# Patient Record
Sex: Female | Born: 1995 | Race: Black or African American | Hispanic: No | Marital: Single | Smoking: Never smoker
Health system: Southern US, Community
[De-identification: ages and names within clinical notes are randomized; demographics above are authoritative.]

## PROBLEM LIST (undated history)

## (undated) DIAGNOSIS — Z8759 Personal history of other complications of pregnancy, childbirth and the puerperium: Secondary | ICD-10-CM

## (undated) DIAGNOSIS — Z01419 Encounter for gynecological examination (general) (routine) without abnormal findings: Secondary | ICD-10-CM

## (undated) DIAGNOSIS — I10 Essential (primary) hypertension: Secondary | ICD-10-CM

## (undated) DIAGNOSIS — D649 Anemia, unspecified: Secondary | ICD-10-CM

## (undated) DIAGNOSIS — N926 Irregular menstruation, unspecified: Secondary | ICD-10-CM

## (undated) DIAGNOSIS — J189 Pneumonia, unspecified organism: Secondary | ICD-10-CM

## (undated) DIAGNOSIS — Z789 Other specified health status: Secondary | ICD-10-CM

## (undated) DIAGNOSIS — N730 Acute parametritis and pelvic cellulitis: Secondary | ICD-10-CM

## (undated) DIAGNOSIS — O009 Unspecified ectopic pregnancy without intrauterine pregnancy: Secondary | ICD-10-CM

## (undated) HISTORY — DX: Anemia, unspecified: D64.9

## (undated) HISTORY — DX: Personal history of other complications of pregnancy, childbirth and the puerperium: Z87.59

## (undated) HISTORY — PX: NO PAST SURGERIES: SHX2092

---
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
## 2014-02-28 ENCOUNTER — Emergency Department (HOSPITAL_COMMUNITY): Payer: Self-pay

## 2014-02-28 ENCOUNTER — Emergency Department (HOSPITAL_COMMUNITY)
Admission: EM | Admit: 2014-02-28 | Discharge: 2014-03-01 | Disposition: A | Discharge status: Home / Self Care | Payer: Self-pay | Attending: Emergency Medicine | Admitting: Emergency Medicine

## 2014-02-28 ENCOUNTER — Encounter (HOSPITAL_COMMUNITY): Payer: Self-pay | Admitting: Emergency Medicine

## 2014-02-28 DIAGNOSIS — R102 Pelvic and perineal pain: Secondary | ICD-10-CM

## 2014-02-28 DIAGNOSIS — Z3202 Encounter for pregnancy test, result negative: Secondary | ICD-10-CM | POA: Insufficient documentation

## 2014-02-28 DIAGNOSIS — N739 Female pelvic inflammatory disease, unspecified: Secondary | ICD-10-CM | POA: Insufficient documentation

## 2014-02-28 DIAGNOSIS — Z791 Long term (current) use of non-steroidal anti-inflammatories (NSAID): Secondary | ICD-10-CM | POA: Insufficient documentation

## 2014-02-28 DIAGNOSIS — N730 Acute parametritis and pelvic cellulitis: Secondary | ICD-10-CM

## 2014-02-28 LAB — COMPREHENSIVE METABOLIC PANEL WITH GFR
ALT: 17 U/L (ref 0–35)
AST: 18 U/L (ref 0–37)
Albumin: 4.2 g/dL (ref 3.5–5.2)
Alkaline Phosphatase: 87 U/L (ref 39–117)
Anion gap: 14 (ref 5–15)
BUN: 11 mg/dL (ref 6–23)
CO2: 22 meq/L (ref 19–32)
Calcium: 9.9 mg/dL (ref 8.4–10.5)
Chloride: 103 meq/L (ref 96–112)
Creatinine, Ser: 0.7 mg/dL (ref 0.50–1.10)
GFR calc Af Amer: >90 mL/min
GFR calc non Af Amer: >90 mL/min
Glucose, Bld: 92 mg/dL (ref 70–99)
Potassium: 4.3 meq/L (ref 3.7–5.3)
Sodium: 139 meq/L (ref 137–147)
Total Bilirubin: 0.3 mg/dL (ref 0.3–1.2)
Total Protein: 8 g/dL (ref 6.0–8.3)

## 2014-02-28 LAB — URINALYSIS, ROUTINE W REFLEX MICROSCOPIC
BILIRUBIN URINE: NEGATIVE
Glucose, UA: NEGATIVE mg/dL
Ketones, ur: NEGATIVE mg/dL
LEUKOCYTES UA: NEGATIVE
NITRITE: NEGATIVE
PROTEIN: NEGATIVE mg/dL
Specific Gravity, Urine: 1.024 (ref 1.005–1.030)
Urobilinogen, UA: 1 mg/dL (ref 0.0–1.0)
pH: 7.5 (ref 5.0–8.0)

## 2014-02-28 LAB — CBC WITH DIFFERENTIAL/PLATELET
Basophils Absolute: 0.1 10*3/uL (ref 0.0–0.1)
Basophils Relative: 0 % (ref 0–1)
EOS PCT: 2 % (ref 0–5)
Eosinophils Absolute: 0.3 10*3/uL (ref 0.0–0.7)
HEMATOCRIT: 43.4 % (ref 36.0–46.0)
Hemoglobin: 14.2 g/dL (ref 12.0–15.0)
LYMPHS PCT: 21 % (ref 12–46)
Lymphs Abs: 2.8 10*3/uL (ref 0.7–4.0)
MCH: 27.4 pg (ref 26.0–34.0)
MCHC: 32.7 g/dL (ref 30.0–36.0)
MCV: 83.6 fL (ref 78.0–100.0)
MONO ABS: 0.7 10*3/uL (ref 0.1–1.0)
MONOS PCT: 5 % (ref 3–12)
Neutro Abs: 9.6 10*3/uL — ABNORMAL HIGH (ref 1.7–7.7)
Neutrophils Relative %: 72 % (ref 43–77)
Platelets: 405 10*3/uL — ABNORMAL HIGH (ref 150–400)
RBC: 5.19 MIL/uL — AB (ref 3.87–5.11)
RDW: 14 % (ref 11.5–15.5)
WBC: 13.4 10*3/uL — ABNORMAL HIGH (ref 4.0–10.5)

## 2014-02-28 LAB — WET PREP, GENITAL
Trich, Wet Prep: NONE SEEN
Yeast Wet Prep HPF POC: NONE SEEN

## 2014-02-28 LAB — PREGNANCY, URINE: Preg Test, Ur: NEGATIVE

## 2014-02-28 LAB — URINE MICROSCOPIC-ADD ON

## 2014-02-28 LAB — LIPASE, BLOOD: Lipase: 32 U/L (ref 11–59)

## 2014-02-28 MED ORDER — LIDOCAINE HCL 1 % IJ SOLN
INTRAMUSCULAR | Status: AC
  Administered 2014-02-28: 0.9 mL
  Filled 2014-02-28: qty 20

## 2014-02-28 MED ORDER — CEFTRIAXONE SODIUM 250 MG IJ SOLR
250.0000 mg | Freq: Once | INTRAMUSCULAR | Status: AC
  Administered 2014-02-28: 250 mg via INTRAMUSCULAR
  Filled 2014-02-28: qty 250

## 2014-02-28 MED ORDER — SODIUM CHLORIDE 0.9 % IV BOLUS (SEPSIS): 1000.0000 mL | Freq: Once | INTRAVENOUS | Status: DC

## 2014-02-28 MED ORDER — DOXYCYCLINE HYCLATE 100 MG PO CAPS: 100.0000 mg | ORAL_CAPSULE | Freq: Two times a day (BID) | ORAL | Status: DC

## 2014-02-28 MED ORDER — HYDROCODONE-ACETAMINOPHEN 5-325 MG PO TABS: 1.0000 | ORAL_TABLET | Freq: Four times a day (QID) | ORAL | Status: DC | PRN

## 2014-02-28 MED ORDER — ONDANSETRON HCL 4 MG/2ML IJ SOLN
4.0000 mg | Freq: Once | INTRAMUSCULAR | Status: DC
  Filled 2014-02-28: qty 2

## 2014-02-28 MED ORDER — IBUPROFEN 800 MG PO TABS: 800.0000 mg | ORAL_TABLET | Freq: Three times a day (TID) | ORAL | Status: DC | PRN

## 2014-02-28 MED ORDER — MORPHINE SULFATE 4 MG/ML IJ SOLN
4.0000 mg | Freq: Once | INTRAMUSCULAR | Status: AC
Start: 2014-02-28 — End: 2014-02-28
  Administered 2014-02-28: 4 mg via INTRAMUSCULAR

## 2014-02-28 MED ORDER — MORPHINE SULFATE 4 MG/ML IJ SOLN
4.0000 mg | Freq: Once | INTRAMUSCULAR | Status: DC
  Filled 2014-02-28 (×2): qty 1

## 2014-02-28 NOTE — ED Notes (Signed)
I had spoken with her earlier and she told me she has had lower abd. Discomfort for about three days and for past two or three days has seen a sm. Amt. Of bright red blood in her stools.  She is in no distress.  She had declined IV at that time.

## 2014-02-28 NOTE — ED Notes (Signed)
Pt from home c/o lower abdominal pain x 1 week with light vaginal bleeding. Pt reports she is unsure if this is period due to depo.

## 2014-02-28 NOTE — ED Notes (Signed)
Pt. Requested pain med before US pelvis be done, this Nurse pulled out ordered pain med and was about to start an IV for the pain meds to be given, pt. REFUSED for IV access and IV pain med. Requested to have PO pain med instead . To notify MD.

## 2014-02-28 NOTE — Discharge Instructions (Signed)
Follow-up at the clinic provided.  Return here as needed.  Increase your fluid intake.  Ultrasound did not show any signs of significant abnormality

## 2014-02-28 NOTE — ED Provider Notes (Signed)
CSN: [id number]     Arrival date & time 02/28/14  1643 History   First MD Initiated Contact with Patient 02/28/14 1729     Chief Complaint  Patient presents with  . Abdominal Pain     (Consider location/radiation/quality/duration/timing/severity/associated sxs/prior Treatment) HPI Patient presents to the emergency department with complaints of suprapubic discomfort and lower abdominal cramping with some vaginal bleeding, she states she previously had taken Depo-Provera shots.  She states that she has not had one since April of this year.  Patient states that she has not noticed any vaginal discharge.  The patient denies chest pain, shortness of breath, headache, blurred vision, weakness, dizziness, back pain, neck pain, fever, headedness rash or syncope.  The patient states that she has not taken any medications prior to arrival.  Patient states nothing seems to make her condition better or worse History reviewed. No pertinent past medical history. History reviewed. No pertinent past surgical history. Family History  Problem Relation Age of Onset  . Cancer Other    History  Substance Use Topics  . Smoking status: Never Smoker   . Smokeless tobacco: Not on file  . Alcohol Use: No   OB History    No data available     Review of Systems All other systems negative except as documented in the HPI. All pertinent positives and negatives as reviewed in the HPI.   Allergies  Review of patient's allergies indicates no known allergies.  Home Medications   Prior to Admission medications   Medication Sig Start Date End Date Taking? Authorizing Provider  acetaminophen (TYLENOL) 500 MG tablet Take 1,000 mg by mouth every 6 (six) hours as needed for mild pain or moderate pain.   Yes Historical Provider, MD  HYDROcodone-acetaminophen (NORCO/VICODIN) 5-325 MG per tablet Take 1 tablet by mouth every 6 (six) hours as needed for moderate pain. 11/04/13  Yes Jamesetta Orleans Norissa Bartee, PA-C  ibuprofen  (ADVIL,MOTRIN) 800 MG tablet Take 1 tablet (800 mg total) by mouth every 8 (eight) hours as needed. 11/04/13  Yes Jamesetta Orleans Daric Koren, PA-C  naproxen sodium (ANAPROX) 220 MG tablet Take 220 mg by mouth 2 (two) times daily with a meal.   Yes Historical Provider, MD   BP 121/70 mmHg  Pulse 90  Temp(Src) 98.4 F (36.9 C) (Oral)  Resp 15  SpO2 100% Physical Exam  Constitutional: She is oriented to person, place, and time. She appears well-developed and well-nourished. No distress.  HENT:  Head: Normocephalic and atraumatic.  Mouth/Throat: Oropharynx is clear and moist.  Eyes: Pupils are equal, round, and reactive to light.  Neck: Normal range of motion. Neck supple.  Cardiovascular: Normal rate, regular rhythm and normal heart sounds.  Exam reveals no gallop and no friction rub.   No murmur heard. Pulmonary/Chest: Effort normal and breath sounds normal. No respiratory distress.  Abdominal: Soft. Bowel sounds are normal. She exhibits no distension. There is tenderness. There is no rebound and no guarding.  Genitourinary: Cervix exhibits motion tenderness and discharge. Cervix exhibits no friability. Right adnexum displays tenderness. Left adnexum displays tenderness. There is bleeding in the vagina. Vaginal discharge found.  Neurological: She is alert and oriented to person, place, and time.  Skin: Skin is warm and dry. No rash noted. No erythema.  Psychiatric: She has a normal mood and affect. Her behavior is normal.  Nursing note and vitals reviewed.   ED Course  Procedures (including critical care time) Labs Review Labs Reviewed  WET PREP, GENITAL - Abnormal; Notable  for the following:    Clue Cells Wet Prep HPF POC   (*)    Value: Multiple bacterial morphotypes present, none predominant. Suggest appropriate recollection if clinically indicated.   WBC, Wet Prep HPF POC RARE (*)    All other components within normal limits  CBC WITH DIFFERENTIAL - Abnormal; Notable for the following:     WBC 13.4 (*)    RBC 5.19 (*)    Platelets 405 (*)    Neutro Abs 9.6 (*)    All other components within normal limits  URINALYSIS, ROUTINE W REFLEX MICROSCOPIC - Abnormal; Notable for the following:    Hgb urine dipstick MODERATE (*)    All other components within normal limits  GC/CHLAMYDIA PROBE AMP  COMPREHENSIVE METABOLIC PANEL  LIPASE, BLOOD  URINE MICROSCOPIC-ADD ON  PREGNANCY, URINE  POC URINE PREG, ED    Imaging Review [place] Transvaginal Non-ob  02/28/2014   CLINICAL DATA:  Pelvic pain.  Negative pregnancy test.  EXAM: TRANSABDOMINAL AND TRANSVAGINAL ULTRASOUND OF PELVIS  DOPPLER ULTRASOUND OF OVARIES  TECHNIQUE: Both transabdominal and transvaginal ultrasound examinations of the pelvis were performed. Transabdominal technique was performed for global imaging of the pelvis including uterus, ovaries, adnexal regions, and pelvic cul-de-sac.  It was necessary to proceed with endovaginal exam following the transabdominal exam to visualize the ovaries and endometrium. Color and duplex Doppler ultrasound was utilized to evaluate blood flow to the ovaries.  COMPARISON:  None.  FINDINGS: Uterus  Measurements: 6.8 x 3.7 x 3.9 cm. No fibroids or other mass visualized.  Endometrium  Thickness: 5 mm.  No focal abnormality visualized.  Right ovary  Measurements: 3.3 x 2.3 x 2 cm. Normal appearance/no adnexal mass.  Left ovary  Measurements: 4.7 x 1.9 x 2.3 cm. 3.4 x 1.7 x 2.1 cm predominately anechoic cyst with thin internal septations trauma no vascular component.  Pulsed Doppler evaluation of both ovaries demonstrates normal low-resistance arterial and venous waveforms.  Other findings  Small amount of free fluid.  IMPRESSION: LEFT adnexal cyst with indeterminate though likely benign characteristics for which follow-up ultrasound is recommended in 6-12 weeks.   Electronically Signed   By: Awilda Metroourtnay  Bloomer   On: 02/28/2014 23:20   [place] Pelvis Complete  02/28/2014   CLINICAL DATA:  Pelvic pain.   Negative pregnancy test.  EXAM: TRANSABDOMINAL AND TRANSVAGINAL ULTRASOUND OF PELVIS  DOPPLER ULTRASOUND OF OVARIES  TECHNIQUE: Both transabdominal and transvaginal ultrasound examinations of the pelvis were performed. Transabdominal technique was performed for global imaging of the pelvis including uterus, ovaries, adnexal regions, and pelvic cul-de-sac.  It was necessary to proceed with endovaginal exam following the transabdominal exam to visualize the ovaries and endometrium. Color and duplex Doppler ultrasound was utilized to evaluate blood flow to the ovaries.  COMPARISON:  None.  FINDINGS: Uterus  Measurements: 6.8 x 3.7 x 3.9 cm. No fibroids or other mass visualized.  Endometrium  Thickness: 5 mm.  No focal abnormality visualized.  Right ovary  Measurements: 3.3 x 2.3 x 2 cm. Normal appearance/no adnexal mass.  Left ovary  Measurements: 4.7 x 1.9 x 2.3 cm. 3.4 x 1.7 x 2.1 cm predominately anechoic cyst with thin internal septations trauma no vascular component.  Pulsed Doppler evaluation of both ovaries demonstrates normal low-resistance arterial and venous waveforms.  Other findings  Small amount of free fluid.  IMPRESSION: LEFT adnexal cyst with indeterminate though likely benign characteristics for which follow-up ultrasound is recommended in 6-12 weeks.   Electronically Signed   By: Pernell Dupreourtnay  Bloomer   On: 02/28/2014 23:20   [place] Art/ven Flow Abd Pelv Doppler  02/28/2014   CLINICAL DATA:  Pelvic pain.  Negative pregnancy test.  EXAM: TRANSABDOMINAL AND TRANSVAGINAL ULTRASOUND OF PELVIS  DOPPLER ULTRASOUND OF OVARIES  TECHNIQUE: Both transabdominal and transvaginal ultrasound examinations of the pelvis were performed. Transabdominal technique was performed for global imaging of the pelvis including uterus, ovaries, adnexal regions, and pelvic cul-de-sac.  It was necessary to proceed with endovaginal exam following the transabdominal exam to visualize the ovaries and endometrium. Color and duplex Doppler  ultrasound was utilized to evaluate blood flow to the ovaries.  COMPARISON:  None.  FINDINGS: Uterus  Measurements: 6.8 x 3.7 x 3.9 cm. No fibroids or other mass visualized.  Endometrium  Thickness: 5 mm.  No focal abnormality visualized.  Right ovary  Measurements: 3.3 x 2.3 x 2 cm. Normal appearance/no adnexal mass.  Left ovary  Measurements: 4.7 x 1.9 x 2.3 cm. 3.4 x 1.7 x 2.1 cm predominately anechoic cyst with thin internal septations trauma no vascular component.  Pulsed Doppler evaluation of both ovaries demonstrates normal low-resistance arterial and venous waveforms.  Other findings  Small amount of free fluid.  IMPRESSION: LEFT adnexal cyst with indeterminate though likely benign characteristics for which follow-up ultrasound is recommended in 6-12 weeks.   Electronically Signed   By: Awilda Metroourtnay  Bloomer   On: 02/28/2014 23:20    The patient will be treated for PID based on her physical exam findings, I advised her [facility]  Told to return here as needed.  MDM   Final diagnoses:  Pelvic pain in female        Carlyle DollyChristopher W Rommie Dunn, PA-C 02/28/14 2338  Tilden FossaElizabeth Rees, MD 03/01/14 0010

## 2014-03-02 LAB — GC/CHLAMYDIA PROBE AMP
CT Probe RNA: NEGATIVE
GC Probe RNA: NEGATIVE

## 2014-05-23 ENCOUNTER — Emergency Department (HOSPITAL_COMMUNITY)
Admission: EM | Admit: 2014-05-23 | Discharge: 2014-05-24 | Disposition: A | Discharge status: Home / Self Care | Payer: Self-pay | Attending: Emergency Medicine | Admitting: Emergency Medicine

## 2014-05-23 ENCOUNTER — Encounter (HOSPITAL_COMMUNITY): Payer: Self-pay | Admitting: Emergency Medicine

## 2014-05-23 ENCOUNTER — Emergency Department (HOSPITAL_COMMUNITY): Payer: Self-pay

## 2014-05-23 DIAGNOSIS — Z79899 Other long term (current) drug therapy: Secondary | ICD-10-CM | POA: Insufficient documentation

## 2014-05-23 DIAGNOSIS — R000 Tachycardia, unspecified: Secondary | ICD-10-CM | POA: Insufficient documentation

## 2014-05-23 DIAGNOSIS — R0602 Shortness of breath: Secondary | ICD-10-CM | POA: Insufficient documentation

## 2014-05-23 DIAGNOSIS — R102 Pelvic and perineal pain: Secondary | ICD-10-CM

## 2014-05-23 DIAGNOSIS — R42 Dizziness and giddiness: Secondary | ICD-10-CM | POA: Insufficient documentation

## 2014-05-23 DIAGNOSIS — N939 Abnormal uterine and vaginal bleeding, unspecified: Secondary | ICD-10-CM | POA: Insufficient documentation

## 2014-05-23 DIAGNOSIS — Z3202 Encounter for pregnancy test, result negative: Secondary | ICD-10-CM | POA: Insufficient documentation

## 2014-05-23 DIAGNOSIS — Z791 Long term (current) use of non-steroidal anti-inflammatories (NSAID): Secondary | ICD-10-CM | POA: Insufficient documentation

## 2014-05-23 HISTORY — DX: Acute parametritis and pelvic cellulitis: N73.0

## 2014-05-23 LAB — WET PREP, GENITAL
CLUE CELLS WET PREP: NONE SEEN
TRICH WET PREP: NONE SEEN
WBC, Wet Prep HPF POC: NONE SEEN
Yeast Wet Prep HPF POC: NONE SEEN

## 2014-05-23 LAB — ABO/RH: ABO/RH(D): O POS

## 2014-05-23 LAB — CBC
HEMATOCRIT: 44.3 % (ref 36.0–46.0)
HEMOGLOBIN: 14.5 g/dL (ref 12.0–15.0)
MCH: 27.5 pg (ref 26.0–34.0)
MCHC: 32.7 g/dL (ref 30.0–36.0)
MCV: 83.9 fL (ref 78.0–100.0)
Platelets: 459 10*3/uL — ABNORMAL HIGH (ref 150–400)
RBC: 5.28 MIL/uL — ABNORMAL HIGH (ref 3.87–5.11)
RDW: 13.9 % (ref 11.5–15.5)
WBC: 12.6 10*3/uL — AB (ref 4.0–10.5)

## 2014-05-23 LAB — URINALYSIS, ROUTINE W REFLEX MICROSCOPIC
Bilirubin Urine: NEGATIVE
Glucose, UA: NEGATIVE mg/dL
KETONES UR: NEGATIVE mg/dL
LEUKOCYTES UA: NEGATIVE
Nitrite: NEGATIVE
PH: 5.5 (ref 5.0–8.0)
PROTEIN: NEGATIVE mg/dL
Specific Gravity, Urine: 1.022 (ref 1.005–1.030)
UROBILINOGEN UA: 0.2 mg/dL (ref 0.0–1.0)

## 2014-05-23 LAB — TYPE AND SCREEN
ABO/RH(D): O POS
Antibody Screen: NEGATIVE

## 2014-05-23 LAB — URINE MICROSCOPIC-ADD ON

## 2014-05-23 LAB — BASIC METABOLIC PANEL
ANION GAP: 9 (ref 5–15)
BUN: 11 mg/dL (ref 6–23)
CO2: 22 mmol/L (ref 19–32)
Calcium: 9.7 mg/dL (ref 8.4–10.5)
Chloride: 107 mmol/L (ref 96–112)
Creatinine, Ser: 0.77 mg/dL (ref 0.50–1.10)
GFR calc Af Amer: >90 mL/min (ref >90)
GFR calc non Af Amer: >90 mL/min (ref >90)
Glucose, Bld: 88 mg/dL (ref 70–99)
Potassium: 3.8 mmol/L (ref 3.5–5.1)
Sodium: 138 mmol/L (ref 135–145)

## 2014-05-23 LAB — POC URINE PREG, ED: Preg Test, Ur: NEGATIVE

## 2014-05-23 LAB — TROPONIN I

## 2014-05-23 LAB — D-DIMER, QUANTITATIVE (NOT AT ARMC)

## 2014-05-23 LAB — I-STAT CG4 LACTIC ACID, ED: Lactic Acid, Venous: 1.15 mmol/L (ref 0.5–2.0)

## 2014-05-23 MED ORDER — SODIUM CHLORIDE 0.9 % IV BOLUS (SEPSIS)
1000.0000 mL | Freq: Once | INTRAVENOUS | Status: AC
  Administered 2014-05-23: 1000 mL via INTRAVENOUS

## 2014-05-23 NOTE — ED Notes (Addendum)
Awake. Verbally responsive. Resp even and unlabored. No audible adventitious breath sounds noted. ABC's intact. Abd soft/nondistended but tender to palpate. Pt ambulated to BR to void. Continues to report to have vaginal bleeding. No N/V/D reported. IV infusing NS at 94099ml/hr without difficulty.

## 2014-05-23 NOTE — ED Notes (Addendum)
Awake. Verbally responsive. A/O x4. Resp even and unlabored. No audible adventitious breath sounds noted. ABC's intact. IV saline lock patent and intact. Pt ambulated to BR with steady gait. 

## 2014-05-23 NOTE — ED Provider Notes (Signed)
CSN: [id number]     Arrival date & time 05/23/14  1725 History   First MD Initiated Contact with Patient 05/23/14 1817     Chief Complaint  Patient presents with  . Vaginal Bleeding  . Dizziness     (Consider location/radiation/quality/duration/timing/severity/associated sxs/prior Treatment) The history is provided by the patient. No language interpreter was used.  Sabrina Hahn is a 19 year old female with past medical history PID presenting to the emergency department with heavy vaginal bleeding but has been ongoing for the past 2 weeks. Patient reported that her last normal menstrual cycle was 04/08/2014. Reported that she gets her period every month and last approximately 5 days. Patient reported that she started bleeding on 05/08/2014 and has not stopped since then. Patient reported that she had a heavy bleed in the beginning and continues to have heavy bleeding. Reported the blood is dark red and clotty-reported that she's been using pads and changes her pads at least 6 times in one hour-reported that the pads are drenched in blood. Reported that she's been having abdominal cramping, lightheadedness and headaches. Reported nausea with no episodes of emesis. Reported that she did use Depo-Provera old year ago but is now discontinued this medication. Patient is currently on no form of BCPs. Denied fever, chills, chest pain, difficulty breathing, travels, fainting, vomiting, diarrhea, back pain, neck pain. PCP [facility]  Past Medical History  Diagnosis Date  . PID (acute pelvic inflammatory disease)    History reviewed. No pertinent past surgical history. Family History  Problem Relation Age of Onset  . Cancer Other    History  Substance Use Topics  . Smoking status: Never Smoker   . Smokeless tobacco: Not on file  . Alcohol Use: No   OB History    No data available     Review of Systems  Constitutional: Negative for fever and chills.  Eyes: Negative for  visual disturbance.  Respiratory: Positive for shortness of breath. Negative for chest tightness.   Cardiovascular: Negative for chest pain.  Gastrointestinal: Negative for nausea, vomiting and abdominal pain.  Genitourinary: Positive for vaginal bleeding and pelvic pain. Negative for dysuria, hematuria, vaginal discharge and vaginal pain.  Neurological: Positive for light-headedness. Negative for dizziness.      Allergies  Review of patient's allergies indicates no known allergies.  Home Medications   Prior to Admission medications   Medication Sig Start Date End Date Taking? Authorizing Provider  acetaminophen (TYLENOL) 500 MG tablet Take 1,000 mg by mouth every 6 (six) hours as needed for mild pain or moderate pain.   Yes Historical Provider, MD  ibuprofen (ADVIL,MOTRIN) 200 MG tablet Take 200 mg by mouth every 6 (six) hours as needed (pain.).   Yes Historical Provider, MD  doxycycline (VIBRAMYCIN) 100 MG capsule Take 1 capsule (100 mg total) by mouth 2 (two) times daily. Patient not taking: Reported on 05/23/2014 02/28/14   Jamesetta Orleans Lawyer, PA-C  HYDROcodone-acetaminophen (NORCO/VICODIN) 5-325 MG per tablet Take 1 tablet by mouth every 6 (six) hours as needed for moderate pain. Patient not taking: Reported on 05/23/2014 02/28/14   Jamesetta Orleans Lawyer, PA-C  ibuprofen (ADVIL,MOTRIN) 800 MG tablet Take 1 tablet (800 mg total) by mouth every 8 (eight) hours as needed. Patient not taking: Reported on 05/23/2014 02/28/14   Jamesetta Orleans Lawyer, PA-C  naproxen sodium (ANAPROX) 220 MG tablet Take 220 mg by mouth 2 (two) times daily with a meal.    Historical Provider, MD   BP 138/52 mmHg  Pulse 63  Temp(Src) 98.2 F (36.8 C) (Oral)  Resp 18  SpO2 100% Physical Exam  Constitutional: She is oriented to person, place, and time. She appears well-developed and well-nourished. No distress.  HENT:  Head: Normocephalic and atraumatic.  Mouth/Throat: Oropharynx is clear and moist. No  oropharyngeal exudate.  Eyes: Conjunctivae and EOM are normal. Pupils are equal, round, and reactive to light. Right eye exhibits no discharge. Left eye exhibits no discharge.  Neck: Normal range of motion. Neck supple. No tracheal deviation present.  Cardiovascular: Regular rhythm and normal heart sounds.  Tachycardia present.   Pulses:      Radial pulses are 2+ on the right side, and 2+ on the left side.  Cap refill less than 3 seconds  Pulmonary/Chest: Effort normal and breath sounds normal. No respiratory distress. She has no wheezes. She has no rales.  Abdominal: Soft. Bowel sounds are normal. She exhibits no distension. There is no tenderness. There is no rebound, no guarding and no CVA tenderness.  Genitourinary:  Pelvic exam: Negative swelling, erythema, inflammation, lesions, sores, deformities identified to the external genitalia. Blood in the vaginal vault identified, bright red with negative clots. Cervix identified with negative friability or abnormalities. Negative CMT or bilateral adnexal tenderness noted. Exam chaperoned with tech, Marcella  Musculoskeletal: Normal range of motion.  Lymphadenopathy:    She has no cervical adenopathy.  Neurological: She is alert and oriented to person, place, and time. No cranial nerve deficit. She exhibits normal muscle tone. Coordination normal.  Cranial nerves grossly intact Equal grip strength Patient follows commands well Patient responds to questions appropriately  Skin: Skin is warm and dry. No rash noted. She is not diaphoretic. No erythema.  Psychiatric: She has a normal mood and affect. Her behavior is normal. Thought content normal.  Nursing note and vitals reviewed.   ED Course  Procedures (including critical care time)  Results for orders placed or performed during the hospital encounter of 05/23/14  Wet prep, genital  Result Value Ref Range   Yeast Wet Prep HPF POC NONE SEEN NONE SEEN   Trich, Wet Prep NONE SEEN NONE SEEN    Clue Cells Wet Prep HPF POC NONE SEEN NONE SEEN   WBC, Wet Prep HPF POC NONE SEEN NONE SEEN  Basic metabolic panel  (at [facility])  Result Value Ref Range   Sodium 138 135 - 145 mmol/L   Potassium 3.8 3.5 - 5.1 mmol/L   Chloride 107 96 - 112 mmol/L   CO2 22 19 - 32 mmol/L   Glucose, Bld 88 70 - 99 mg/dL   BUN 11 6 - 23 mg/dL   Creatinine, Ser 1.61 0.50 - 1.10 mg/dL   Calcium 9.7 8.4 - 09.6 mg/dL   GFR calc non Af Amer >90 >90 mL/min   GFR calc Af Amer >90 >90 mL/min   Anion gap 9 5 - 15  CBC  (at [facility])  Result Value Ref Range   WBC 12.6 (H) 4.0 - 10.5 K/uL   RBC 5.28 (H) 3.87 - 5.11 MIL/uL   Hemoglobin 14.5 12.0 - 15.0 g/dL   HCT 04.5 40.9 - 81.1 %   MCV 83.9 78.0 - 100.0 fL   MCH 27.5 26.0 - 34.0 pg   MCHC 32.7 30.0 - 36.0 g/dL   RDW 91.4 78.2 - 95.6 %   Platelets 459 (H) 150 - 400 K/uL  Urinalysis, Routine w reflex microscopic  Result Value Ref Range   Color,  Urine YELLOW YELLOW   APPearance CLOUDY (A) CLEAR   Specific Gravity, Urine 1.022 1.005 - 1.030   pH 5.5 5.0 - 8.0   Glucose, UA NEGATIVE NEGATIVE mg/dL   Hgb urine dipstick LARGE (A) NEGATIVE   Bilirubin Urine NEGATIVE NEGATIVE   Ketones, ur NEGATIVE NEGATIVE mg/dL   Protein, ur NEGATIVE NEGATIVE mg/dL   Urobilinogen, UA 0.2 0.0 - 1.0 mg/dL   Nitrite NEGATIVE NEGATIVE   Leukocytes, UA NEGATIVE NEGATIVE  Urine microscopic-add on  Result Value Ref Range   Squamous Epithelial / LPF RARE RARE   WBC, UA 0-2 <3 WBC/hpf   RBC / HPF TOO NUMEROUS TO COUNT <3 RBC/hpf   Urine-Other MUCOUS PRESENT   D-dimer, quantitative  Result Value Ref Range   D-Dimer, Quant <0.27 0.00 - 0.48 ug/mL-FEU  Troponin I  Result Value Ref Range   Troponin I <0.03 <0.031 ng/mL  POC Urine Pregnancy, ED  (If Pre-menopausal female) - [facility]  Result Value Ref Range   Preg Test, Ur NEGATIVE NEGATIVE  I-Stat CG4 Lactic Acid, ED  Result Value Ref Range   Lactic Acid, Venous 1.15 0.5 - 2.0 mmol/L  Type  and screen for Red Blood Exchange  Result Value Ref Range   ABO/RH(D) O POS    Antibody Screen NEG    Sample Expiration 05/26/2014   ABO/Rh  Result Value Ref Range   ABO/RH(D) O POS     Labs Review Labs Reviewed  CBC - Abnormal; Notable for the following:    WBC 12.6 (*)    RBC 5.28 (*)    Platelets 459 (*)    All other components within normal limits  URINALYSIS, ROUTINE W REFLEX MICROSCOPIC - Abnormal; Notable for the following:    APPearance CLOUDY (*)    Hgb urine dipstick LARGE (*)    All other components within normal limits  WET PREP, GENITAL  BASIC METABOLIC PANEL  URINE MICROSCOPIC-ADD ON  D-DIMER, QUANTITATIVE  TROPONIN I  POC URINE PREG, ED  I-STAT CG4 LACTIC ACID, ED  TYPE AND SCREEN  ABO/RH  GC/CHLAMYDIA PROBE AMP ([place])    Imaging Review [place] Transvaginal Non-ob  05/23/2014   CLINICAL DATA:  Pelvic pain.  EXAM: TRANSABDOMINAL AND TRANSVAGINAL ULTRASOUND OF PELVIS  DOPPLER ULTRASOUND OF OVARIES  TECHNIQUE: Both transabdominal and transvaginal ultrasound examinations of the pelvis were performed. Transabdominal technique was performed for global imaging of the pelvis including uterus, ovaries, adnexal regions, and pelvic cul-de-sac.  It was necessary to proceed with endovaginal exam following the transabdominal exam to visualize the uterus, ovaries, and adnexa. Color and duplex Doppler ultrasound was utilized to evaluate blood flow to the ovaries.  COMPARISON:  02/28/2014  FINDINGS: Uterus  Measurements: 6.9 x 3.4 x 4.1 cm. No fibroids or other mass visualized. Retroverted.  Endometrium  Thickness: 5.1 cm.  No focal abnormality visualized.  Right ovary  Measurements: 4.7 x 1.9 x 2.4 cm. Normal appearance/no adnexal mass.  Left ovary  Measurements: 3.7 x 2.2 x 2.7 cm. Normal appearance/no adnexal mass.  Pulsed Doppler evaluation of both ovaries demonstrates normal low-resistance arterial and venous waveforms.  Other findings  Trace free pelvic fluid is likely  physiologic.  IMPRESSION: Normal pelvic ultrasound for age. No explanation for pelvic pain. No evidence of ovarian or adnexal torsion.   Electronically Signed   By: Jeronimo GreavesKyle  Talbot M.D.   On: 05/23/2014 21:08   [place] Pelvis Complete  05/23/2014   CLINICAL DATA:  Pelvic pain.  EXAM: TRANSABDOMINAL  AND TRANSVAGINAL ULTRASOUND OF PELVIS  DOPPLER ULTRASOUND OF OVARIES  TECHNIQUE: Both transabdominal and transvaginal ultrasound examinations of the pelvis were performed. Transabdominal technique was performed for global imaging of the pelvis including uterus, ovaries, adnexal regions, and pelvic cul-de-sac.  It was necessary to proceed with endovaginal exam following the transabdominal exam to visualize the uterus, ovaries, and adnexa. Color and duplex Doppler ultrasound was utilized to evaluate blood flow to the ovaries.  COMPARISON:  02/28/2014  FINDINGS: Uterus  Measurements: 6.9 x 3.4 x 4.1 cm. No fibroids or other mass visualized. Retroverted.  Endometrium  Thickness: 5.1 cm.  No focal abnormality visualized.  Right ovary  Measurements: 4.7 x 1.9 x 2.4 cm. Normal appearance/no adnexal mass.  Left ovary  Measurements: 3.7 x 2.2 x 2.7 cm. Normal appearance/no adnexal mass.  Pulsed Doppler evaluation of both ovaries demonstrates normal low-resistance arterial and venous waveforms.  Other findings  Trace free pelvic fluid is likely physiologic.  IMPRESSION: Normal pelvic ultrasound for age. No explanation for pelvic pain. No evidence of ovarian or adnexal torsion.   Electronically Signed   By: Jeronimo Greaves M.D.   On: 05/23/2014 21:08   [place] Art/ven Flow Abd Pelv Doppler  05/23/2014   CLINICAL DATA:  Pelvic pain.  EXAM: TRANSABDOMINAL AND TRANSVAGINAL ULTRASOUND OF PELVIS  DOPPLER ULTRASOUND OF OVARIES  TECHNIQUE: Both transabdominal and transvaginal ultrasound examinations of the pelvis were performed. Transabdominal technique was performed for global imaging of the pelvis including uterus, ovaries, adnexal regions, and  pelvic cul-de-sac.  It was necessary to proceed with endovaginal exam following the transabdominal exam to visualize the uterus, ovaries, and adnexa. Color and duplex Doppler ultrasound was utilized to evaluate blood flow to the ovaries.  COMPARISON:  02/28/2014  FINDINGS: Uterus  Measurements: 6.9 x 3.4 x 4.1 cm. No fibroids or other mass visualized. Retroverted.  Endometrium  Thickness: 5.1 cm.  No focal abnormality visualized.  Right ovary  Measurements: 4.7 x 1.9 x 2.4 cm. Normal appearance/no adnexal mass.  Left ovary  Measurements: 3.7 x 2.2 x 2.7 cm. Normal appearance/no adnexal mass.  Pulsed Doppler evaluation of both ovaries demonstrates normal low-resistance arterial and venous waveforms.  Other findings  Trace free pelvic fluid is likely physiologic.  IMPRESSION: Normal pelvic ultrasound for age. No explanation for pelvic pain. No evidence of ovarian or adnexal torsion.   Electronically Signed   By: Jeronimo Greaves M.D.   On: 05/23/2014 21:08     EKG Interpretation None       Orthostatic VS for the past 24 hrs:  BP- Lying Pulse- Lying BP- Sitting Pulse- Sitting BP- Standing at 0 minutes Pulse- Standing at 0 minutes  05/23/14 1838 127/87 mmHg 103 105/83 mmHg 102 (!) 112/97 mmHg 113       MDM   Final diagnoses:  Pelvic pain in female  Abnormal uterine bleeding    Medications  sodium chloride 0.9 % bolus 1,000 mL (0 mLs Intravenous Stopped 05/23/14 2052)  sodium chloride 0.9 % bolus 1,000 mL (0 mLs Intravenous Stopped 05/23/14 2215)    Filed Vitals:   05/23/14 2059 05/23/14 2130 05/23/14 2200 05/23/14 2322  BP: 121/79 129/78 130/84 138/52  Pulse: 82 80 85 63  Temp:      TempSrc:      Resp: 18   18  SpO2: 100% 100% 100% 100%   EKG noted normal sinus rhythm with a heart rate of a 4 bpm. Troponin negative elevation. D-dimer negative elevation. CBC noted white blood cell count of  12.6. Hemoglobin 14.5, hematocrit 44.3. BMP unremarkable. Lactic acid negative elevation. Urine  pregnancy negative. Urinalysis noted large hemoglobin-negative nitrites, leukocytes. Wet prep unremarkable. Ultrasounds noted normal pelvic ultrasound-patient for pelvic pain. No evidence of ovarian or adnexal torsion. Patient presenting to the ED with abnormal uterine bleeding. Ultrasound unremarkable for acute abnormalities-negative findings of ovarian torsion or uterine fibroids. Patient given IV fluids in ED setting-heart rate decreased from 1 10 bpm to 63 bpm. Hemoglobin unremarkable - 14.5 - negative drop in hemoglobin. Negative findings of UTI or pyelonephritis. Abdominal exam unremarkable-doubt acute abdominal processes. Mild orthostatics identified-patient was hydrated in ED setting with relief. Discussed case in great detail with attending physician who recommends patient be discharged home and for patient to follow-up as outpatient with OB/GYN. Patient stable, afebrile. Patient not septic appearing. Discharged patient. Referred patient to OB/[facility]N, [facility]. Discussed with patient to rest and stay hydrated. Discussed with patient to closely monitor symptoms and if symptoms are to worsen or change to report back to the ED - strict return instructions given.  Patient agreed to plan of care, understood, all questions answered.    Raymon Mutton, PA-C 05/24/14 0010  Tilden Fossa, MD 05/24/14 [phone]

## 2014-05-23 NOTE — ED Notes (Signed)
Awake. Verbally responsive. A/O x4. Resp even and unlabored. No audible adventitious breath sounds noted. ABC's intact. Family at bedside. IV infusing NS at 96299ml/hr without difficulty.

## 2014-05-23 NOTE — ED Notes (Signed)
Awake. Verbally responsive. A/O x4. Resp even and unlabored. No audible adventitious breath sounds noted. ABC's intact. IV infusing NS at 999ml/hr without difficulty. 

## 2014-05-23 NOTE — ED Notes (Signed)
Awake. Verbally responsive. Resp even and unlabored. No audible adventitious breath sounds noted. ABC's intact. Abd soft/nondistended but tender to palpate. BS (+) and active x4 quadrants. No N/V/D reported. IV patent and intact completed infusion of NS without difficulty.

## 2014-05-23 NOTE — ED Notes (Signed)
Pt reports profuse vaginal bleeding for past 2 weeks. Pt reports she is using over a pad an hour. Pt reports lightheadedness and dizziness. Ambulatory to triage room. Pt had depo injection a year ago and cycles have been off since.

## 2014-05-23 NOTE — ED Notes (Signed)
Pt alert and oriented x4. Respirations even and unlabored, bilateral symmetrical rise and fall of chest. Skin warm and dry. In no acute distress. Denies needs.   

## 2014-05-24 NOTE — ED Notes (Signed)
Awake. Verbally responsive. A/O x4. Resp even and unlabored. No audible adventitious breath sounds noted. ABC's intact.  

## 2014-05-24 NOTE — Discharge Instructions (Signed)
Please call your doctor for a followup appointment within 24-48 hours. When you talk to your doctor please let them know that you were seen in the emergency department and have them acquire all of your records so that they can discuss the findings with you and formulate a treatment plan to fully care for your new and ongoing problems. Please call and set-up an appointment with OBGYN and [facility] Please rest and stay hydrated Please drink plenty of water Please continue to monitor symptoms closely and if symptoms are to worsen or change (fever greater than 101, chills, sweating, nausea, vomiting, chest pain, shortness of breathe, difficulty breathing, weakness, numbness, tingling, worsening or changes to pain pattern, fainting, dizziness, headache, vision changes, loss of sensation, stomach pain) please report back to the Emergency Department immediately.    Abnormal Uterine Bleeding Abnormal uterine bleeding can affect women at various stages in life, including teenagers, women in their reproductive years, pregnant women, and women who have reached menopause. Several kinds of uterine bleeding are considered abnormal, including:  Bleeding or spotting between periods.   Bleeding after sexual intercourse.   Bleeding that is heavier or more than normal.   Periods that last longer than usual.  Bleeding after menopause.  Many cases of abnormal uterine bleeding are minor and simple to treat, while others are more serious. Any type of abnormal bleeding should be evaluated by your health care provider. Treatment will depend on the cause of the bleeding. HOME CARE INSTRUCTIONS Monitor your condition for any changes. The following actions may help to alleviate any discomfort you are experiencing:  Avoid the use of tampons and douches as directed by your health care provider.  Change your pads frequently. You should get regular pelvic exams and Pap tests. Keep all follow-up appointments for  diagnostic tests as directed by your health care provider.  SEEK MEDICAL CARE IF:   Your bleeding lasts more than 1 week.   You feel dizzy at times.  SEEK IMMEDIATE MEDICAL CARE IF:   You pass out.   You are changing pads every 15 to 30 minutes.   You have abdominal pain.  You have a fever.   You become sweaty or weak.   You are passing large blood clots from the vagina.   You start to feel nauseous and vomit. MAKE SURE YOU:   Understand these instructions.  Will watch your condition.  Will get help right away if you are not doing well or get worse. Document Released: 03/06/2005 Document Revised: 03/11/2013 Document Reviewed: 10/03/2012 [facility] Patient Information 2015 [employer] [place]. This information is not intended to replace advice given to you by your health care provider. Make sure you discuss any questions you have with your health care provider.   Emergency Department Resource Guide 1) Find a Doctor and Pay Out of Pocket Although you won't have to find out who is covered by your insurance plan, it is a good idea to ask around and get recommendations. You will then need to call the office and see if the doctor you have chosen will accept you as a new patient and what types of options they offer for patients who are self-pay. Some doctors offer discounts or will set up payment plans for their patients who do not have insurance, but you will need to ask so you aren't surprised when you get to your appointment.  2) Contact Your Local Health Department Not all health departments have doctors that can see patients for sick visits, but  many do, so it is worth a call to see if yours does. If you don't know where your local health department is, you can check in your phone book. The CDC also has a tool to help you locate your state's health department, and many state websites also have listings of all of their local health departments.  3) Find a Walk-in Clinic If your  illness is not likely to be very severe or complicated, you may want to try a walk in clinic. These are popping up all over the country in pharmacies, drugstores, and shopping centers. They're usually staffed by nurse practitioners or physician assistants that have been trained to treat common illnesses and complaints. They're usually fairly quick and inexpensive. However, if you have serious medical issues or chronic medical problems, these are probably not your best option.  No Primary Care Doctor: - Call Health Connect at  [phone] - they can help you locate a primary care doctor that  accepts your insurance, provides certain services, etc. - Physician Referral Service- [phone]  Chronic Pain Problems: Organization         Address  Phone   Notes  [facility]  [phone] Patients need to be referred by their primary care doctor.   Medication Assistance: Organization         Address  Phone   Notes  [facility] Medication [facility] [address] [place] [place], [place] [address] [phone] --Must be a resident of [facility] -- Must have NO insurance coverage whatsoever (no Medicaid/ Medicare, etc.) -- The pt. MUST have a primary care doctor that directs their care regularly and follows them in the community   [facility]  [phone]   [employer]  [phone]    Agencies that provide inexpensive medical care: Organization         Address  Phone   Notes  [facility]  [phone]   [facility]    [phone]   [facility] [address] [place], [place] [address] [phone]   [facility] [address] [place] [place] [phone]   Planned Parenthood    [phone]   [facility]    [phone]   [facility] and [facility]  [address], [place] Phone:  [phone], Fax:  [phone] Hours of Operation:   9 am - 6 pm, M-F.  Also accepts Medicaid/Medicare and self-pay.  [facility]  [address], [place] Phone: [phone], Fax: [phone]. Hours of Operation:  8:30 am - 5:30 pm, M-F.  Also accepts Medicaid and self-pay.  [facility] [place] [address], [place] Phone: [phone]   [facility] [address] Natasha Bence [place], [place] [phone], Ext. [phone] Mondays & Thursdays: 7-9 AM.  First 15 patients are seen on a first come, first serve basis.    Medicaid-accepting [facility] Providers:  Organization         Address  Phone   Notes  [facility] [address], [place] [phone] Also accepts self-pay patients.  [facility] [address] Laurell Josephs [place], [place]  [phone]   [facility] [address], [place] [phone]   [facility] [address] [place] [phone]   Adrian Saran  [place] [address], [place]   [phone] Only accepts [place] patients after they have their name applied to their card.   Self-Pay (no insurance) in [facility]:  Organization         Address  Phone   Notes  Sickle Cell Patients, [facility] [address] [place], [place] [phone]   [facility] [address] [place], [place] [phone]   [facility] [place]  [address], [place] [phone]   [facility]/Dr. Osei-Bonsu  [address], [place] or [address], [place] [phone] Phone number for both [place] and [place] locations is the same.  Urgent Medical and [facility] [address], [place] [phone]   [facility] [address], [place] or [address] [phone]   [facility] [address] [place] [phone], phone; [phone], fax Sees patients 1st and 3rd Saturday of every month.  Must not qualify for public or private insurance (i.e. Medicaid, Medicare, [place] Health Choice, Veterans' Benefits)  Household income should be no more than 200% of the poverty level The clinic cannot treat you if you are pregnant or think you are pregnant  Sexually transmitted diseases are not treated at the clinic.    Dental Care: Organization         Address  Phone  Notes  [facility] Department of [facility] [address] [place], [place] [phone] Accepts children up to age 27 who are enrolled in [place] or [place] Health Choice; pregnant women with a Medicaid card; and children who have applied for Medicaid or [place] Health Choice, but were declined, whose parents can pay a reduced fee at time of service.  [facility] Department of [facility]  [address], [place] [phone] Accepts children up to age 32 who are enrolled in [place] or [place] Health Choice; pregnant women with a Medicaid card; and children who have applied for Medicaid or [place] Health Choice, but were declined, whose parents can pay a reduced fee at time of service.  [facility]  [address] [place], [place] [phone] Patients are seen by appointment only. Walk-ins are not accepted. [facility] will see patients 70 years of age and older. Monday - Tuesday (8am-5pm) Most Wednesdays (8:30-5pm) $30 per visit, cash only  [facility]  [address], [facility] [phone] Patients are seen by appointment only. Walk-ins are not accepted. [facility] will see patients 28 years of age and older. One Wednesday Evening (Monthly: Volunteer Based).  $30 per visit, cash only  [employer]  [phone] for adults; Children under age 59, call Graduate Pediatric Dentistry at  [phone]. Children aged 60-14, please call [phone] to request a pediatric application.  Dental services are provided in all areas of dental care including fillings, crowns and bridges, complete and partial dentures, implants, gum treatment, root canals, and extractions. Preventive care is also provided. Treatment is provided to both adults and children. Patients are selected via a lottery and there is often a waiting list.   [facility] [address] [place]  [phone] [email or web address]   [facility] [address] [place], [place] [phone], Ext. [phone]  Second and Fourth Thursday of each month, opens at 6:30 AM; Clinic ends at 9 AM.  Patients are seen on a first-come first-served basis, and a limited number are seen during each clinic.   [facility]  [address] Ether [place]ins [place], [place] [phone]   Eligibility Requirements You must have lived in [place], [place], or [place] counties for at least the last three months.   You cannot be eligible for state or federal sponsored [place], including [employer], [place], or [employer].   You generally cannot be eligible for healthcare insurance through your employer.    How to apply: Eligibility screenings are held every Tuesday and Wednesday afternoon from 1:00 pm until 4:00 pm. You do not need an appointment for the interview!  [facility] [address], [place], [place] [phone]   [facility] Health Department  [phone]   [facility] Health Department  [phone]   [facility] Health Department  [phone]    Behavioral Health Resources in the Community: Intensive Outpatient Programs Organization         Address  Phone  Notes  [facility] [address] [place], [place] [phone]   [facility] [address] [place], [place] [phone]   [facility] Alcohol [facility]  Drug [facility] [address], [place], [place]  [phone]   [facility] Mental Health [address],  [place], [place] [phone] or [phone]   Substance Abuse Resources Organization         Address  Phone  Notes  [facility]  [phone]   [facility]  [phone]   [facility] [place]  [phone]   Floydene Flock  [phone]   Residential & Outpatient Substance Abuse Program  [phone]   Psychological Services Organization         Address  Phone  Notes  [facility] Behavioral Health  [phone]   [facility]  [phone]   [facility] [address], [place] [phone] or [phone]    Mobile Crisis Teams Organization         Address  Phone  Notes  [facility], Mobile Crisis Care Unit  [phone]   [facility]  [address] [place] [address] [place], [place] [phone]   Doristine Locks [address] [place] [phone]    Self-Help/Support Groups Organization         Address  Phone             Notes  [facility] [place] - variety of support groups  336- I7437963 Call for more information  Narcotics Anonymous (NA), [facility] [address], [employer] [place]  2 meetings at this location   Statistician         Address  Phone  Notes  [facility] [address],    [place]  [phone]   [facility]  [address] [place] [address], [place], [place] [phone]   [facility] [address] [place], [place] [phone] Admissions: 8am-3pm M-F  [facility] [address],    [place], [place] [phone]   [facility] [address] Starling Manns [place], [place] [phone]   [facility] [address]  [place], [place] [phone]   [facility] - Intensive Outpatient [address] Laurell Josephs [address], [place], [place]  [phone]   [facility] ([facility]  [address]  [place], [place] [phone] or [phone]   [facility] ([facility]) [address], [place], [place] [phone] Accepts Medicaid  [facility] [address]  [place] [phone] Substance Abuse/Addiction Treatment   [facility] Organization         Address  Phone  Notes  [facility]  [phone]   Angie Fava, PhD [address] Ervin Knack [place], [place]   4753290945 or [phone]   [facility] Behavioral   [address] [place], [place] [phone]   [facility] [address] [place], [place] [phone] Insurance/Medicaid/sponsorship through [facility] [address]                                    [place], [place] [phone] Therapy/tele-psych/case  [facility] [address], [place] [phone]    Dr. Lolly Mustache  [phone]   [facility] [place]  [employer] [facility] Dept. 1) [address] [place] 2) [address] [place] 3)  [address], [place] [phone]  [phone]   [facility] Child Abuse Hotline 332-308-8544 or [phone] (After Hours)

## 2014-05-25 LAB — GC/CHLAMYDIA PROBE AMP (~~LOC~~) NOT AT ARMC
CHLAMYDIA, DNA PROBE: NEGATIVE
Neisseria Gonorrhea: NEGATIVE

## 2015-02-25 ENCOUNTER — Encounter (HOSPITAL_COMMUNITY): Payer: Self-pay

## 2015-02-25 ENCOUNTER — Inpatient Hospital Stay (HOSPITAL_COMMUNITY): Payer: Self-pay

## 2015-02-25 ENCOUNTER — Inpatient Hospital Stay (HOSPITAL_COMMUNITY)
Admission: AD | Admit: 2015-02-25 | Discharge: 2015-02-25 | Disposition: A | Discharge status: Home / Self Care | Payer: Self-pay | Source: Ambulatory Visit | Attending: Obstetrics and Gynecology | Admitting: Obstetrics and Gynecology

## 2015-02-25 DIAGNOSIS — O26851 Spotting complicating pregnancy, first trimester: Secondary | ICD-10-CM

## 2015-02-25 DIAGNOSIS — O98811 Other maternal infectious and parasitic diseases complicating pregnancy, first trimester: Secondary | ICD-10-CM | POA: Insufficient documentation

## 2015-02-25 DIAGNOSIS — O23591 Infection of other part of genital tract in pregnancy, first trimester: Secondary | ICD-10-CM | POA: Insufficient documentation

## 2015-02-25 DIAGNOSIS — R1084 Generalized abdominal pain: Secondary | ICD-10-CM

## 2015-02-25 DIAGNOSIS — N760 Acute vaginitis: Secondary | ICD-10-CM | POA: Insufficient documentation

## 2015-02-25 DIAGNOSIS — Z3A01 Less than 8 weeks gestation of pregnancy: Secondary | ICD-10-CM | POA: Insufficient documentation

## 2015-02-25 DIAGNOSIS — B3731 Acute candidiasis of vulva and vagina: Secondary | ICD-10-CM

## 2015-02-25 DIAGNOSIS — N9489 Other specified conditions associated with female genital organs and menstrual cycle: Secondary | ICD-10-CM

## 2015-02-25 DIAGNOSIS — O209 Hemorrhage in early pregnancy, unspecified: Secondary | ICD-10-CM | POA: Insufficient documentation

## 2015-02-25 DIAGNOSIS — O26899 Other specified pregnancy related conditions, unspecified trimester: Secondary | ICD-10-CM

## 2015-02-25 DIAGNOSIS — R109 Unspecified abdominal pain: Secondary | ICD-10-CM

## 2015-02-25 DIAGNOSIS — B373 Candidiasis of vulva and vagina: Secondary | ICD-10-CM | POA: Insufficient documentation

## 2015-02-25 DIAGNOSIS — B9689 Other specified bacterial agents as the cause of diseases classified elsewhere: Secondary | ICD-10-CM | POA: Insufficient documentation

## 2015-02-25 LAB — URINALYSIS, ROUTINE W REFLEX MICROSCOPIC
Glucose, UA: NEGATIVE mg/dL
Ketones, ur: >80 mg/dL — AB
Leukocytes, UA: NEGATIVE
Nitrite: NEGATIVE
PH: 5.5 (ref 5.0–8.0)
Protein, ur: 30 mg/dL — AB

## 2015-02-25 LAB — URINE MICROSCOPIC-ADD ON

## 2015-02-25 LAB — CREATININE, SERUM
Creatinine, Ser: 0.59 mg/dL (ref 0.44–1.00)
GFR calc non Af Amer: >60 mL/min (ref >60)

## 2015-02-25 LAB — WET PREP, GENITAL
SPERM: NONE SEEN
Trich, Wet Prep: NONE SEEN

## 2015-02-25 LAB — AST: AST: 19 U/L (ref 15–41)

## 2015-02-25 LAB — HCG, QUANTITATIVE, PREGNANCY: HCG, BETA CHAIN, QUANT, S: 7970 m[IU]/mL — AB (ref <5)

## 2015-02-25 LAB — BUN: BUN: 11 mg/dL (ref 6–20)

## 2015-02-25 LAB — CBC
HCT: 36.8 % (ref 36.0–46.0)
Hemoglobin: 12.2 g/dL (ref 12.0–15.0)
MCH: 26.8 pg (ref 26.0–34.0)
MCHC: 33.2 g/dL (ref 30.0–36.0)
MCV: 80.9 fL (ref 78.0–100.0)
Platelets: 384 10*3/uL (ref 150–400)
RBC: 4.55 MIL/uL (ref 3.87–5.11)
RDW: 14.5 % (ref 11.5–15.5)
WBC: 12 10*3/uL — ABNORMAL HIGH (ref 4.0–10.5)

## 2015-02-25 LAB — POCT PREGNANCY, URINE: PREG TEST UR: POSITIVE — AB

## 2015-02-25 MED ORDER — METHOTREXATE INJECTION FOR WOMEN'S HOSPITAL
50.0000 mg/m2 | Freq: Once | INTRAMUSCULAR | Status: AC
  Administered 2015-02-25: 85 mg via INTRAMUSCULAR
  Filled 2015-02-25: qty 1.7

## 2015-02-25 MED ORDER — METRONIDAZOLE 500 MG PO TABS: 500.0000 mg | ORAL_TABLET | Freq: Two times a day (BID) | ORAL | Status: DC

## 2015-02-25 MED ORDER — FLUCONAZOLE 150 MG PO TABS: 150.0000 mg | ORAL_TABLET | Freq: Once | ORAL | Status: DC

## 2015-02-25 NOTE — MAU Provider Note (Signed)
History     CSN: [id number]  Arrival date and time: 02/25/15 1450   First Provider Initiated Contact with Patient 02/25/15 1534      Chief Complaint  Patient presents with  . Vaginal Bleeding  . Abdominal Cramping   HPI   Ms.Sabrina Hahn is a 19 y.o. female G1P0 at 40w2dpresenting to MAU with vaginal bleeding and abdominal pain that started today. [facility]. She was not having any problems yesterday. Her abdominal pain is located in the lower part of her stomach; worse in the center. The pain is constant.   Last intercourse was unknown  OB History    Gravida Para Term Preterm AB TAB SAB Ectopic Multiple Living   1               Past Medical History  Diagnosis Date  . PID (acute pelvic inflammatory disease)     History reviewed. No pertinent past surgical history.  Family History  Problem Relation Age of Onset  . Cancer Other     Social History  Substance Use Topics  . Smoking status: Never Smoker   . Smokeless tobacco: None  . Alcohol Use: No    Allergies: No Known Allergies  Prescriptions prior to admission  Medication Sig Dispense Refill Last Dose  . acetaminophen (TYLENOL) 500 MG tablet Take 1,000 mg by mouth every 6 (six) hours as needed for mild pain or moderate pain.   05/22/2014 at Unknown time  . doxycycline (VIBRAMYCIN) 100 MG capsule Take 1 capsule (100 mg total) by mouth 2 (two) times daily. (Patient not taking: Reported on 05/23/2014) 28 capsule 0   . HYDROcodone-acetaminophen (NORCO/VICODIN) 5-325 MG per tablet Take 1 tablet by mouth every 6 (six) hours as needed for moderate pain. (Patient not taking: Reported on 05/23/2014) 15 tablet 0   . ibuprofen (ADVIL,MOTRIN) 200 MG tablet Take 200 mg by mouth every 6 (six) hours as needed (pain.).   05/22/2014 at Unknown time  . ibuprofen (ADVIL,MOTRIN) 800 MG tablet Take 1 tablet (800 mg total) by mouth every 8 (eight) hours as needed. (Patient  not taking: Reported on 05/23/2014) 21 tablet 0   . naproxen sodium (ANAPROX) 220 MG tablet Take 220 mg by mouth 2 (two) times daily with a meal.   02/27/2014 at Unknown time   Results for orders placed or performed during the hospital encounter of 02/25/15 (from the past 48 hour(s))  Urinalysis, Rout[facility] w reflex microscopic [facility]     Status: Abnormal   Collection Time: 02/25/15  3:00 PM  Result Value Ref Range   Color, Urine YELLOW YELLOW   APPearance HAZY (A) CLEAR   Specific Gravity, Urine >1.030 (H) 1.005 - 1.030   pH 5.5 5.0 - 8.0   Glucose, UA NEGATIVE NEGATIVE mg/dL   Hgb urine dipstick LARGE (A) NEGATIVE   Bilirubin Urine SMALL (A) NEGATIVE   Ketones, ur >80 (A) NEGATIVE mg/dL   Protein, ur 30 (A) NEGATIVE mg/dL   Nitrite NEGATIVE NEGATIVE   Leukocytes, UA NEGATIVE NEGATIVE  Urine microscopic-add on     Status: Abnormal   Collection Time: 02/25/15  3:00 PM  Result Value Ref Range   Squamous Epithelial / LPF 6-30 (A) NONE SEEN   WBC, UA 0-5 0 - 5 WBC/hpf   RBC / HPF TOO NUMEROUS TO COUNT 0 - 5 RBC/hpf   Bacteria, UA MANY (A) NONE SEEN   Urine-Other MUCOUS PRESENT  Pregnancy, urine POC     Status: Abnormal   Collection Time: 02/25/15  3:27 PM  Result Value Ref Range   Preg Test, Ur POSITIVE (A) NEGATIVE    Comment:        THE SENSITIVITY OF THIS METHODOLOGY IS >24 mIU/mL   Wet prep, genital     Status: Abnormal   Collection Time: 02/25/15  3:40 PM  Result Value Ref Range   Yeast Wet Prep HPF POC PRESENT (A) NONE SEEN   Trich, Wet Prep NONE SEEN NONE SEEN   Clue Cells Wet Prep HPF POC PRESENT (A) NONE SEEN   WBC, Wet Prep HPF POC MODERATE (A) NONE SEEN    Comment: BACTERIA- TOO NUMEROUS TO COUNT   Sperm NONE SEEN   CBC     Status: Abnormal   Collection Time: 02/25/15  4:16 PM  Result Value Ref Range   WBC 12.0 (H) 4.0 - 10.5 K/uL   RBC 4.55 3.87 - 5.11 MIL/uL   Hemoglobin 12.2 12.0 - 15.0 g/dL   HCT 36.8 36.0 - 46.0 %   MCV 80.9 78.0 - 100.0 fL   MCH  26.8 26.0 - 34.0 pg   MCHC 33.2 30.0 - 36.0 g/dL   RDW 14.5 11.5 - 15.5 %   Platelets 384 150 - 400 K/uL  hCG, quantitative, pregnancy     Status: Abnormal   Collection Time: 02/25/15  4:16 PM  Result Value Ref Range   hCG, Beta Chain, Quant, S 7970 (H) <5 mIU/mL    Comment:          GEST. AGE      CONC.  (mIU/mL)   <=1 WEEK        5 - 50     2 WEEKS       50 - 500     3 WEEKS       100 - 10,000     4 WEEKS     1,000 - 30,000     5 WEEKS     3,500 - 115,000   6-8 WEEKS     12,000 - 270,000    12 WEEKS     15,000 - 220,000        FEMALE AND NON-PREGNANT FEMALE:     LESS THAN 5 mIU/mL   AST     Status: None   Collection Time: 02/25/15  4:16 PM  Result Value Ref Range   AST 19 15 - 41 U/L  BUN     Status: None   Collection Time: 02/25/15  4:16 PM  Result Value Ref Range   BUN 11 6 - 20 mg/dL  Creatinine, serum     Status: None   Collection Time: 02/25/15  4:16 PM  Result Value Ref Range   Creatinine, Ser 0.59 0.44 - 1.00 mg/dL   GFR calc non Af Amer >60 >60 mL/min   GFR calc Af Amer >60 >60 mL/min    Comment: (NOTE) The eGFR has been calculated using the CKD EPI equation. This calculation has not been validated in all clinical situations. eGFR's persistently <60 mL/min signify possible Chronic Kidney Disease.     US Ob Comp Less 14 Wks  02/25/2015  CLINICAL DATA:  Pelvic pain cramping. Vaginal bleeding. Gestational age by LMP of 5 weeks 2 days. EXAM: OBSTETRIC <14 WK [place] AND TRANSVAGINAL OB US TECHNIQUE: Both transabdominal and transvaginal ultrasound examinations were performed for complete evaluation of the gestation as well as the maternal uterus, adnexal regions,  and pelvic cul-de-sac. Transvaginal technique was performed to assess early pregnancy. COMPARISON:  None. FINDINGS: No intrauterine gestational sac or other fluid collection visualized in endometrial cavity. Uterus is retroverted. No fibroids identified. Left ovary is normal in appearance. Small right ovarian corpus  luteum cyst noted. There is a small heterogeneous mass seen in the right adnexa located superior to the ovary which measures 1.5 x 1.0 by 1.2 cm. This is suspicious for an ectopic pregnancy. Trace amount of simple free fluid seen within pelvic cul-de-sac. IMPRESSION: No intrauterine gestational sac identified. 1.5 cm right adnexal mass appears separate from the ovary, and is highly suspicious for an ectopic pregnancy. Trace amount of simple free fluid noted. Critical Value/emergent results were called by telephone at the time of interpretation on 02/25/2015 at 5:08 pm to Dr. Anderson [facility] , who verbally acknowledged these results. Electronically Signed   By: Earle Gell M.D.   On: 02/25/2015 17:14   US Ob Transvaginal  02/25/2015  CLINICAL DATA:  Pelvic pain cramping. Vaginal bleeding. Gestational age by LMP of 5 weeks 2 days. EXAM: OBSTETRIC <14 WK [place] AND TRANSVAGINAL OB US TECHNIQUE: Both transabdominal and transvaginal ultrasound examinations were performed for complete evaluation of the gestation as well as the maternal uterus, adnexal regions, and pelvic cul-de-sac. Transvaginal technique was performed to assess early pregnancy. COMPARISON:  None. FINDINGS: No intrauterine gestational sac or other fluid collection visualized in endometrial cavity. Uterus is retroverted. No fibroids identified. Left ovary is normal in appearance. Small right ovarian corpus luteum cyst noted. There is a small heterogeneous mass seen in the right adnexa located superior to the ovary which measures 1.5 x 1.0 by 1.2 cm. This is suspicious for an ectopic pregnancy. Trace amount of simple free fluid seen within pelvic cul-de-sac. IMPRESSION: No intrauterine gestational sac identified. 1.5 cm right adnexal mass appears separate from the ovary, and is highly suspicious for an ectopic pregnancy. Trace amount of simple free fluid noted. Critical Value/emergent results were called by telephone at the time of interpretation on 02/25/2015  at 5:08 pm to Dr. Anderson [facility] , who verbally acknowledged these results. Electronically Signed   By: Earle Gell M.D.   On: 02/25/2015 17:14     Review of Systems  Constitutional: Negative for fever and chills.  Gastrointestinal: Positive for nausea, abdominal pain (Lower-Mid abdomen. Come and go) and constipation. Negative for vomiting and diarrhea.  Genitourinary: Negative for dysuria.   Physical Exam   Blood pressure 115/80, pulse 85, temperature 98.1 F (36.7 C), temperature source Oral, resp. rate 18, height 5' 1"  (1.549 m), weight 140 lb 3.2 oz (63.594 kg), last menstrual period 01/19/2015.  Physical Exam  Constitutional: She is oriented to person, place, and time. She appears well-developed and well-nourished. No distress.  GI: Normal appearance. There is tenderness in the suprapubic area. There is no rigidity, no rebound and no guarding.  Genitourinary:  Speculum exam: Vagina - Small amount of dark red blood noted in the vagina.  Cervix - + active bleeding from the cervix. Small amount.  Bimanual exam: Cervix closed, no CMT  Uterus non tender, normal size Adnexa non tender, no masses bilaterally GC/Chlam, wet prep done Chaperone present for exam.  Musculoskeletal: Normal range of motion.  Neurological: She is alert and oriented to person, place, and time.  Skin: Skin is warm. She is not diaphoretic.  Psychiatric: Her behavior is normal.    MAU Course  Procedures  none  MDM  O positive blood type  Beta hcg  level [place] ordered    Discussed US findings with Dr. Elly Modena. Due to size of the ectopic 1.5X1.0x1.2 cm> patient is hemodynamically stable I will offer and counsel her on MTX to treat right adnexal mass. Patient was notified that due to her high quant levels a second dose of MTX may be necessary.   Patient is agreeable to receive MTX today for the treatment of a right ectopic pregnancy.  MTX given IM by RN in MAU.    Assessment and Plan   A:  1.  Adnexal mass   2. Vaginal bleeding in pregnancy, first trimester   3. Abdominal pain in pregnancy   4. Yeast vaginitis   5. Bacterial vaginosis     P:  Discharge home in stable condition Return Sunday 12/11 for day #4 labs  Return sooner if symptoms worsen Ectopic precautions Pelvic rest  Support given  RX: flagyl (no alcohol), diflucan.  Increase PO fluid intake.   Lezlie Lye, NP 02/25/2015 7:09 PM

## 2015-02-25 NOTE — MAU Note (Signed)
Pt reports she started having abd pain and cramping.

## 2015-02-25 NOTE — Discharge Instructions (Signed)
Methotrexate Treatment for an Ectopic Pregnancy °Methotrexate is a medicine that treats ectopic pregnancy by stopping the growth of the fertilized egg. It also helps your body absorb tissue from the egg. This takes between 2 weeks and 6 weeks. Most ectopic pregnancies can be successfully treated with methotrexate if they are detected early enough. °LET YOUR HEALTH CARE PROVIDER KNOW ABOUT: °· Any allergies you have. °· All medicines you are taking, including vitamins, herbs, eye drops, creams, and over-the-counter medicines. °· Medical conditions you have. °RISKS AND COMPLICATIONS °Generally, this is a safe treatment. However, as with any treatment, problems can occur. Possible problems or side effects include: °· Nausea. °· Vomiting. °· Diarrhea. °· Abdominal cramping. °· Mouth sores. °· Increased vaginal bleeding or spotting.   °· Swelling or irritation of the lining of your lungs (pneumonitis).  °· Failed treatment and continuation of the pregnancy.   °· Liver damage. °· Hair loss. °There is still a risk of the ectopic pregnancy rupturing while using the methotrexate. °BEFORE THE PROCEDURE °Before you take the medicine:  °· Liver tests, kidney tests, and a complete blood test are performed. °· Blood tests are performed to measure the pregnancy hormone levels and to determine your blood type. °· If you are Rh-negative and the father is Rh-positive or his Rh type is not known, you will be given a Rho (D) immune globulin shot. °PROCEDURE  °There are two methods that your health care provider may use to prescribe methotrexate. One method involves a single dose or injection of the medicine. Another method involves a series of doses given through several injections.  °AFTER THE PROCEDURE °· You may have some abdominal cramping, vaginal bleeding, and fatigue in the first few days after taking methotrexate. °· Blood tests will be taken for several weeks to check the pregnancy hormone levels. The blood tests are performed  until there is no more pregnancy hormone detected in the blood. °  °This information is not intended to replace advice given to you by your health care provider. Make sure you discuss any questions you have with your health care provider. °  °Document Released: 02/28/2001 Document Revised: 03/27/2014 Document Reviewed: 12/23/2012 °Elsevier Interactive Patient Education ©2016 Elsevier Inc[employer] ° °Ectopic Pregnancy °An ectopic pregnancy is when the fertilized egg attaches (implants) outside the uterus. Most ectopic pregnancies occur in the fallopian tube. Rarely do ectopic pregnancies occur on the ovary, intestine, pelvis, or cervix. In an ectopic pregnancy, the fertilized egg does not have the ability to develop into a normal, healthy baby.  °A ruptured ectopic pregnancy is one in which the fallopian tube gets torn or bursts and results in internal bleeding. Often there is intense abdominal pain, and sometimes, vaginal bleeding. Having an ectopic pregnancy can be life threatening. If left untreated, this dangerous condition can lead to a blood transfusion, abdominal surgery, or even death. °CAUSES  °Damage to the fallopian tubes is the suspected cause in most ectopic pregnancies.  °RISK FACTORS °Depending on your circumstances, the risk of having an ectopic pregnancy will vary. The level of risk can be divided into three categories. °High Risk °· You have gone through infertility treatment. °· You have had a previous ectopic pregnancy. °· You have had previous tubal surgery. °· You have had previous surgery to have the fallopian tubes tied (tubal ligation). °· You have tubal problems or diseases. °· You have been exposed to DES. DES is a medicine that was used until 1971 and had effects on babies whose mothers took the medicine. °·   You become pregnant while using an intrauterine device (IUD) for birth control.  °Moderate Risk °· You have a history of infertility. °· You have a history of a sexually transmitted infection  (STI). °· You have a history of pelvic inflammatory disease (PID). °· You have scarring from endometriosis. °· You have multiple sexual partners. °· You smoke.  °Low Risk °· You have had previous pelvic surgery. °· You use vaginal douching. °· You became sexually active before 20 years of age. °SIGNS AND SYMPTOMS  °An ectopic pregnancy should be suspected in anyone who has missed a period and has abdominal pain or bleeding. °· You may experience normal pregnancy symptoms, such as: °¨ Nausea. °¨ Tiredness. °¨ Breast tenderness. °· Other symptoms may include: °¨ Pain with intercourse. °¨ Irregular vaginal bleeding or spotting. °¨ Cramping or pain on one side or in the lower abdomen. °¨ Fast heartbeat. °¨ Passing out while having a bowel movement. °· Symptoms of a ruptured ectopic pregnancy and internal bleeding may include: °¨ Sudden, severe pain in the abdomen and pelvis. °¨ Dizziness or fainting. °¨ Pain in the shoulder area. °DIAGNOSIS  °Tests that may be performed include: °· A pregnancy test. °· An ultrasound test. °· Testing the specific level of pregnancy hormone in the bloodstream. °· Taking a sample of uterus tissue (dilation and curettage, D&C). °· Surgery to perform a visual exam of the inside of the abdomen using a thin, lighted tube with a tiny camera on the end (laparoscope). °TREATMENT  °An injection of a medicine called methotrexate may be given. This medicine causes the pregnancy tissue to be absorbed. It is given if: °· The diagnosis is made early. °· The fallopian tube has not ruptured. °· You are considered to be a good candidate for the medicine. °Usually, pregnancy hormone blood levels are checked after methotrexate treatment. This is to be sure the medicine is effective. It may take 4-6 weeks for the pregnancy to be absorbed (though most pregnancies will be absorbed by 3 weeks). °Surgical treatment may be needed. A laparoscope may be used to remove the pregnancy tissue. If severe internal  bleeding occurs, a cut (incision) may be made in the lower abdomen (laparotomy), and the ectopic pregnancy is removed. This stops the bleeding. Part of the fallopian tube, or the whole tube, may be removed as well (salpingectomy). After surgery, pregnancy hormone tests may be done to be sure there is no pregnancy tissue left. You may receive a Rho (D) immune globulin shot if you are Rh negative and the father is Rh positive, or if you do not know the Rh type of the father. This is to prevent problems with any future pregnancy. °SEEK IMMEDIATE MEDICAL CARE IF:  °You have any symptoms of an ectopic pregnancy. This is a medical emergency. °MAKE SURE YOU: °· Understand these instructions. °· Will watch your condition. °· Will get help right away if you are not doing well or get worse. °  °This information is not intended to replace advice given to you by your health care provider. Make sure you discuss any questions you have with your health care provider. °  °Document Released: 04/13/2004 Document Revised: 03/27/2014 Document Reviewed: 10/03/2012 °Elsevier Interactive Patient Education ©2016 Elsevier Inc. ° °

## 2015-02-26 LAB — URINE CULTURE: SPECIAL REQUESTS: NORMAL

## 2015-02-26 LAB — GC/CHLAMYDIA PROBE AMP (~~LOC~~) NOT AT ARMC
Chlamydia: NEGATIVE
Neisseria Gonorrhea: NEGATIVE

## 2015-02-26 LAB — HIV ANTIBODY (ROUTINE TESTING W REFLEX): HIV Screen 4th Generation wRfx: NONREACTIVE

## 2015-02-28 ENCOUNTER — Inpatient Hospital Stay (HOSPITAL_COMMUNITY)
Admission: AD | Admit: 2015-02-28 | Discharge: 2015-02-28 | Disposition: A | Discharge status: Home / Self Care | Payer: Self-pay | Source: Ambulatory Visit | Attending: Obstetrics & Gynecology | Admitting: Obstetrics & Gynecology

## 2015-02-28 DIAGNOSIS — O209 Hemorrhage in early pregnancy, unspecified: Secondary | ICD-10-CM | POA: Insufficient documentation

## 2015-02-28 DIAGNOSIS — Z3A01 Less than 8 weeks gestation of pregnancy: Secondary | ICD-10-CM | POA: Insufficient documentation

## 2015-02-28 DIAGNOSIS — O008 Other ectopic pregnancy without intrauterine pregnancy: Secondary | ICD-10-CM

## 2015-02-28 LAB — HCG, QUANTITATIVE, PREGNANCY: hCG, Beta Chain, Quant, S: 7277 m[IU]/mL — ABNORMAL HIGH (ref <5)

## 2015-02-28 NOTE — MAU Note (Signed)
Pt here for F/U BHCG, received MTX on 12/8.  Pt denies pain, still bleeding - has changed one pad this a.m.

## 2015-02-28 NOTE — MAU Provider Note (Signed)
Chief Complaint  Patient presents with  . Follow-up    Subjective:   Pt is a 19 y.o. G1P0 here for follow-up BHCG.  Upon review of the records patient was first seen on 02-25-15 for vaginal bleeding and abdominal pain.   BHCG on that day was 7970.  Ultrasound showed no IUGS, Right adnexal mass highly suspicious for ectopic pregnancy.  GC/CT and wet prep were collected.  Results were negative.   Pt discharged home after methotrexate injection.   Pt here today with no report of abdominal pain or vaginal bleeding.   All other systems negative.    Past Medical History  Diagnosis Date  . PID (acute pelvic inflammatory disease)     OB History  Gravida Para Term Preterm AB SAB TAB Ectopic Multiple Living  1             # Outcome Date GA Lbr Len/2nd Weight Sex Delivery Anes PTL Lv  1 Current               Family History  Problem Relation Age of Onset  . Cancer Other     Objective: Physical Exam  Filed Vitals:   02/28/15 1303  BP: 111/67  Pulse: 81  Temp: 97.9 F (36.6 C)  Resp: 18   Constitutional: She is oriented to person, place, and time. She appears well-developed and well-nourished. No distress.  Pulmonary/Chest: Effort normal. No respiratory distress.  Musculoskeletal: Normal range of motion.  Neurological: She is alert and oriented to person, place, and time.  Skin: Skin is warm and dry.  Results for orders placed or performed during the hospital encounter of 02/28/15 (from the past 24 hour(s))  hCG, quantitative, pregnancy     Status: Abnormal   Collection Time: 02/28/15  1:06 PM  Result Value Ref Range   hCG, Beta Chain, Quant, S 7277 (H) <5 mIU/mL    Assessment: 19 y.o. G1P0 at 411w5d wks Pregnancy Follow-up BHCG  Plan: Falling quant today for day 4 labs Return on Wednesday for Day 7 labs. Return sooner if having severe pain or severe vaginal bleeding. Continue strict ectopic precautions.  Client in agreement with the plan.

## 2015-03-03 ENCOUNTER — Encounter (HOSPITAL_COMMUNITY): Payer: Self-pay | Admitting: *Deleted

## 2015-03-03 ENCOUNTER — Inpatient Hospital Stay (HOSPITAL_COMMUNITY)
Admission: AD | Admit: 2015-03-03 | Discharge: 2015-03-03 | Disposition: A | Discharge status: Home / Self Care | Payer: Self-pay | Source: Ambulatory Visit | Attending: Obstetrics & Gynecology | Admitting: Obstetrics & Gynecology

## 2015-03-03 DIAGNOSIS — Z3A01 Less than 8 weeks gestation of pregnancy: Secondary | ICD-10-CM | POA: Insufficient documentation

## 2015-03-03 DIAGNOSIS — O009 Unspecified ectopic pregnancy without intrauterine pregnancy: Secondary | ICD-10-CM

## 2015-03-03 LAB — HCG, QUANTITATIVE, PREGNANCY: HCG, BETA CHAIN, QUANT, S: 5219 m[IU]/mL — AB (ref <5)

## 2015-03-03 NOTE — Discharge Instructions (Signed)
Ectopic Pregnancy °An ectopic pregnancy happens when a fertilized egg grows outside the uterus. A pregnancy cannot live outside of the uterus. This problem often happens in the fallopian tube. It is often caused by damage to the fallopian tube. °If this problem is found early, you may be treated with medicine. If your tube tears or bursts open (ruptures), you will bleed inside. This is an emergency. You will need surgery. Get help right away.  °SYMPTOMS °You may have normal pregnancy symptoms at first. These include: °· Missing your period. °· Feeling sick to your stomach (nauseous). °· Being tired. °· Having tender breasts. °Then, you may start to have symptoms that are not normal. These include: °· Pain with sex (intercourse). °· Bleeding from the vagina. This includes light bleeding (spotting). °· Belly (abdomen) or lower belly cramping or pain. This may be felt on one side. °· A fast heartbeat (pulse). °· Passing out (fainting) after going poop (bowel movement). °If your tube tears, you may have symptoms such as: °· Really bad pain in the belly or lower belly. This happens suddenly. °· Dizziness. °· Passing out. °· Shoulder pain. °GET HELP RIGHT AWAY IF:  °You have any of these symptoms. This is an emergency. °MAKE SURE YOU: °· Understand these instructions. °· Will watch your condition. °· Will get help right away if you are not doing well or get worse. °  °This information is not intended to replace advice given to you by your health care provider. Make sure you discuss any questions you have with your health care provider. °  °Document Released: 06/02/2008 Document Revised: 03/11/2013 Document Reviewed: 10/16/2012 °Elsevier Interactive Patient Education ©2016 [employer] Inc. ° °Methotrexate Treatment for an Ectopic Pregnancy, Care After °Refer to this sheet in the next few weeks. These instructions provide you with information on caring for yourself after your procedure. Your health care provider may also give  you more specific instructions. Your treatment has been planned according to current medical practices, but problems sometimes occur. Call your health care provider if you have any problems or questions after your procedure. °WHAT TO EXPECT AFTER THE PROCEDURE °You may have some abdominal cramping, vaginal bleeding, and fatigue in the first few days after taking methotrexate. Some other possible side effects of methotrexate include: °· Nausea. °· Vomiting. °· Diarrhea. °· Mouth sores. °· Swelling or irritation of the lining of your lungs (pneumonitis). °· Liver damage. °· Hair loss. °HOME CARE INSTRUCTIONS  °After you have received the methotrexate medicine, you need to be careful of your activities and watch your condition for several weeks. It may take 1 week before your hormone levels return to normal. °· Keep all follow-up appointments as directed by your health care provider. °· Avoid traveling too far away from your health care provider. °· Do not have sexual intercourse until your health care provider says it is safe to do so. °· You may resume your usual diet. °· Limit strenuous activity. °· Do not take folic acid, prenatal vitamins, or other vitamins that contain folic acid. °· Do not take aspirin, ibuprofen, or naproxen (nonsteroidal anti-inflammatory drugs [NSAIDs]). °· Do not drink alcohol. °SEEK MEDICAL CARE IF:  °· You cannot control your nausea and vomiting. °· You cannot control your diarrhea. °· You have sores in your mouth and want treatment. °· You need pain medicine for your abdominal pain. °· You have a rash. °· You are having a reaction to the medicine. °SEEK IMMEDIATE MEDICAL CARE IF:  °· You have   increasing abdominal or pelvic pain. °· You notice increased bleeding. °· You feel light-headed, or you faint. °· You have shortness of breath. °· Your heart rate increases. °· You have a cough. °· You have chills. °· You have a fever. °  °This information is not intended to replace advice given to  you by your health care provider. Make sure you discuss any questions you have with your health care provider. °  °Document Released: 02/23/2011 Document Revised: 03/11/2013 Document Reviewed: 12/23/2012 °Elsevier Interactive Patient Education ©2016 [employer] Inc. ° °

## 2015-03-03 NOTE — MAU Note (Addendum)
Day 7 after MTX, nausea, no bleeding no cramps, woke up nauseated

## 2015-03-03 NOTE — MAU Provider Note (Signed)
History   [id number]   Chief Complaint  Patient presents with  . Follow-up    HPI Sabrina Hahn is a 19 y.o. female G1P0 here for follow-up BHCG. This is day 7 s/p methotrexate for ectopic pregnancy.  Patient denies abdominal pain or vaginal bleeding.   Patient's last menstrual period was 01/19/2015.  OB History  Gravida Para Term Preterm AB SAB TAB Ectopic Multiple Living  1             # Outcome Date GA Lbr Len/2nd Weight Sex Delivery Anes PTL Lv  1 Gravida               Past Medical History  Diagnosis Date  . PID (acute pelvic inflammatory disease)     Family History  Problem Relation Age of Onset  . Cancer Other     Social History   Social History  . Marital Status: Single    Spouse Name: N/A  . Number of Children: N/A  . Years of Education: N/A   Social History Main Topics  . Smoking status: Never Smoker   . Smokeless tobacco: Not on file  . Alcohol Use: No  . Drug Use: No  . Sexual Activity: Yes    Birth Control/ Protection: None   Other Topics Concern  . Not on file   Social History Narrative    No Known Allergies  No current facility-administered medications on file prior to encounter.   Current Outpatient Prescriptions on File Prior to Encounter  Medication Sig Dispense Refill  . fluconazole (DIFLUCAN) 150 MG tablet Take 1 tablet (150 mg total) by mouth once. 1 tablet 0  . metroNIDAZOLE (FLAGYL) 500 MG tablet Take 1 tablet (500 mg total) by mouth 2 (two) times daily. 14 tablet 0     Physical Exam   Filed Vitals:   03/03/15 1128 03/03/15 1130  BP: 112/63   Pulse:  77  Temp: 98.7 F (37.1 C)   Resp: 18     Physical Exam  Constitutional: She appears well-developed and well-nourished. No distress.  Cardiovascular: Normal rate.   Respiratory: Effort normal. No respiratory distress.  Musculoskeletal: Normal range of motion.  Skin: She is not diaphoretic.  Psychiatric: She has a normal mood and affect. Her behavior is normal.  Judgment and thought content normal.    MAU Course  Procedures Component     Latest Ref Rng 02/25/2015 02/28/2015 03/03/2015  HCG, Beta Chain, Quant, S     <5 mIU/mL 7970 (H) 7277 (H) 5219 (H)   MDM BHCG dropped >15% day 4 to day 7 Denies pain or bleeding S/w Dr. Macon LargeAnyanwu. Patient to f/u in 1 week for BHCG in clinic  Assessment and Plan  19 y.o. G1P0 at [id number] wks Pregnancy Follow-up BHCG 1. Ectopic pregnancy    P: Discharge home Discussed reasons to return to MAU Msg sent to clinic for [facility]  Judeth HornErin Shany Marinez, NP 03/03/2015 12:29 PM

## 2015-03-10 ENCOUNTER — Other Ambulatory Visit: Payer: Self-pay

## 2015-03-10 DIAGNOSIS — O009 Unspecified ectopic pregnancy without intrauterine pregnancy: Secondary | ICD-10-CM

## 2015-03-11 LAB — HCG, QUANTITATIVE, PREGNANCY: HCG, BETA CHAIN, QUANT, S: 839.3 m[IU]/mL — AB

## 2015-03-12 ENCOUNTER — Telehealth: Payer: Self-pay | Admitting: *Deleted

## 2015-03-12 NOTE — Telephone Encounter (Signed)
Attempted to call patient to let her know to come in next week for another b-hcg. There was no answer, left message stating I was calling to schedule her to come in for additional labwork. As we are closing for the holiday, asked her to return our call on Tuesday to schedule.

## 2015-03-17 ENCOUNTER — Other Ambulatory Visit: Payer: Self-pay

## 2015-03-17 DIAGNOSIS — Z8759 Personal history of other complications of pregnancy, childbirth and the puerperium: Secondary | ICD-10-CM

## 2015-03-17 NOTE — Telephone Encounter (Signed)
PT SCHEDULED TO COME IN ON 03/17/2015

## 2015-03-18 LAB — HCG, QUANTITATIVE, PREGNANCY: HCG, BETA CHAIN, QUANT, S: 221.6 m[IU]/mL — AB

## 2015-03-24 ENCOUNTER — Other Ambulatory Visit: Payer: Self-pay

## 2015-03-24 DIAGNOSIS — O009 Unspecified ectopic pregnancy without intrauterine pregnancy: Secondary | ICD-10-CM

## 2015-03-24 LAB — HCG, QUANTITATIVE, PREGNANCY: HCG, BETA CHAIN, QUANT, S: 68.5 m[IU]/mL — AB

## 2015-03-29 ENCOUNTER — Telehealth: Payer: Self-pay | Admitting: General Practice

## 2015-03-29 NOTE — Telephone Encounter (Signed)
Spoke with patient she will returned to clinic on 01/11

## 2015-03-29 NOTE — Telephone Encounter (Signed)
**Note De-Identified via Sabrina Hahn** Per OACZYSAWalidah, patient needs to come in for bhcg on 1/11 for s/p MTX. Called patient, no answer- left message stating we are trying to reach you with results, please call us back at the clinics.

## 2015-03-29 NOTE — Progress Notes (Unsigned)
Message routed to staff to have patient return on 03/31/15 for BHCG.  Pt is post MTX and on weekly blood draw.

## 2015-03-31 ENCOUNTER — Other Ambulatory Visit: Payer: Self-pay

## 2015-03-31 DIAGNOSIS — O039 Complete or unspecified spontaneous abortion without complication: Secondary | ICD-10-CM

## 2015-04-01 LAB — HCG, QUANTITATIVE, PREGNANCY: hCG, Beta Chain, Quant, S: 30.1 m[IU]/mL — ABNORMAL HIGH

## 2015-04-02 NOTE — Telephone Encounter (Signed)
Per Dr Shawnie PonsPratt patient needs to return in one week for BHCG. Attempted to call patient, phone was not accepting calls. Called alternate number, got voice mail. Left message stating I was attempting to contact patient about non-urgent results, and she should please contact the clinic on Monday.

## 2015-04-05 NOTE — Telephone Encounter (Signed)
Left another message for patient to call back concerning BHCG

## 2015-04-07 ENCOUNTER — Other Ambulatory Visit: Payer: Self-pay

## 2015-04-07 DIAGNOSIS — O039 Complete or unspecified spontaneous abortion without complication: Secondary | ICD-10-CM

## 2015-04-07 NOTE — Telephone Encounter (Signed)
Pt has been scheduled for 01/18/20187 according to lab

## 2015-04-08 LAB — HCG, QUANTITATIVE, PREGNANCY: HCG, BETA CHAIN, QUANT, S: 10.4 m[IU]/mL — AB

## 2015-04-09 ENCOUNTER — Telehealth: Payer: Self-pay

## 2015-04-09 NOTE — Telephone Encounter (Signed)
-----   Message from Willodean Rosenthal, MD sent at 04/08/2015  2:00 PM EST ----- Please call pt. She does not need further HCG testing.  Thx, clh-S

## 2015-04-09 NOTE — Telephone Encounter (Signed)
Attempted to reach patient concerning no follow up hcg needed at this time but there was no answer on cell phone.

## 2015-04-12 NOTE — Telephone Encounter (Signed)
Pt informed of lab results. 

## 2015-06-17 ENCOUNTER — Encounter (HOSPITAL_COMMUNITY): Payer: Self-pay | Admitting: *Deleted

## 2015-06-17 ENCOUNTER — Emergency Department (HOSPITAL_COMMUNITY)
Admission: EM | Admit: 2015-06-17 | Discharge: 2015-06-17 | Disposition: A | Discharge status: Home / Self Care | Payer: Self-pay | Attending: Emergency Medicine | Admitting: Emergency Medicine

## 2015-06-17 ENCOUNTER — Emergency Department (HOSPITAL_COMMUNITY): Payer: Self-pay

## 2015-06-17 DIAGNOSIS — Y9289 Other specified places as the place of occurrence of the external cause: Secondary | ICD-10-CM | POA: Insufficient documentation

## 2015-06-17 DIAGNOSIS — Z792 Long term (current) use of antibiotics: Secondary | ICD-10-CM | POA: Insufficient documentation

## 2015-06-17 DIAGNOSIS — Z3202 Encounter for pregnancy test, result negative: Secondary | ICD-10-CM | POA: Insufficient documentation

## 2015-06-17 DIAGNOSIS — Y998 Other external cause status: Secondary | ICD-10-CM | POA: Insufficient documentation

## 2015-06-17 DIAGNOSIS — W01198A Fall on same level from slipping, tripping and stumbling with subsequent striking against other object, initial encounter: Secondary | ICD-10-CM | POA: Insufficient documentation

## 2015-06-17 DIAGNOSIS — Y9301 Activity, walking, marching and hiking: Secondary | ICD-10-CM | POA: Insufficient documentation

## 2015-06-17 DIAGNOSIS — S0990XA Unspecified injury of head, initial encounter: Secondary | ICD-10-CM | POA: Insufficient documentation

## 2015-06-17 DIAGNOSIS — Z8742 Personal history of other diseases of the female genital tract: Secondary | ICD-10-CM | POA: Insufficient documentation

## 2015-06-17 DIAGNOSIS — R519 Headache, unspecified: Secondary | ICD-10-CM

## 2015-06-17 DIAGNOSIS — R51 Headache: Secondary | ICD-10-CM

## 2015-06-17 LAB — BASIC METABOLIC PANEL
Anion gap: 8 (ref 5–15)
BUN: 9 mg/dL (ref 6–20)
CHLORIDE: 107 mmol/L (ref 101–111)
CO2: 23 mmol/L (ref 22–32)
CREATININE: 0.79 mg/dL (ref 0.44–1.00)
Calcium: 9.6 mg/dL (ref 8.9–10.3)
GFR calc non Af Amer: >60 mL/min (ref >60)
Glucose, Bld: 94 mg/dL (ref 65–99)
Potassium: 4.4 mmol/L (ref 3.5–5.1)
Sodium: 138 mmol/L (ref 135–145)

## 2015-06-17 LAB — URINALYSIS, ROUTINE W REFLEX MICROSCOPIC
Bilirubin Urine: NEGATIVE
GLUCOSE, UA: NEGATIVE mg/dL
Ketones, ur: NEGATIVE mg/dL
Nitrite: NEGATIVE
PH: 5.5 (ref 5.0–8.0)
PROTEIN: NEGATIVE mg/dL
Specific Gravity, Urine: 1.027 (ref 1.005–1.030)

## 2015-06-17 LAB — CBC
HEMATOCRIT: 37.3 % (ref 36.0–46.0)
Hemoglobin: 12 g/dL (ref 12.0–15.0)
MCH: 26.7 pg (ref 26.0–34.0)
MCHC: 32.2 g/dL (ref 30.0–36.0)
MCV: 83.1 fL (ref 78.0–100.0)
PLATELETS: 373 10*3/uL (ref 150–400)
RBC: 4.49 MIL/uL (ref 3.87–5.11)
RDW: 15.4 % (ref 11.5–15.5)
WBC: 11.6 10*3/uL — ABNORMAL HIGH (ref 4.0–10.5)

## 2015-06-17 LAB — URINE MICROSCOPIC-ADD ON

## 2015-06-17 LAB — I-STAT BETA HCG BLOOD, ED (MC, WL, AP ONLY): I-stat hCG, quantitative: <5 m[IU]/mL (ref <5)

## 2015-06-17 MED ORDER — OXYCODONE-ACETAMINOPHEN 5-325 MG PO TABS
1.0000 | ORAL_TABLET | Freq: Once | ORAL | Status: AC
  Administered 2015-06-17: 1 via ORAL
  Filled 2015-06-17: qty 1

## 2015-06-17 MED ORDER — TRAMADOL HCL 50 MG PO TABS: 50.0000 mg | ORAL_TABLET | Freq: Four times a day (QID) | ORAL | Status: DC | PRN

## 2015-06-17 NOTE — ED Notes (Signed)
Pt took bath last nite and then went she got out of bath she was dizzy and then patient passed out and hit left posterior head on the back of the door.  No dizziness today, only head hurts, no neck pain

## 2015-06-17 NOTE — ED Provider Notes (Signed)
CSN: [id number]     Arrival date & time 06/17/15  1847 History   First MD Initiated Contact with Patient 06/17/15 2057     Chief Complaint  Patient presents with  . Near Syncope  . Head Injury     (Consider location/radiation/quality/duration/timing/severity/associated sxs/prior Treatment) HPI Comments: Patient with no significant medical history presents with complaint of occipital headache persistent since hitting her head 24 hours ago at home. She reports that last night she became dizzy after taking a bath, and fell while walking toward her bed. There was no nausea, vomiting or pain associated with the dizziness. She states that while walking across her bedroom, "the lights went out" and she remembers falling and hitting her head on the door. Today she states that the symptoms of dizziness have resolved but she has a constant headache despite use of Tylenol and ibuprofen. No nausea, vomiting today. She denies recent illness. No neck pain or other injury during fall last night.   Patient is a 20 y.o. female presenting with near-syncope and head injury. The history is provided by the patient. No language interpreter was used.  Near Syncope Associated symptoms include headaches. Pertinent negatives include no chills, fever, nausea, neck pain, vomiting or weakness.  Head Injury Associated symptoms: headache   Associated symptoms: no nausea, no neck pain and no vomiting     Past Medical History  Diagnosis Date  . PID (acute pelvic inflammatory disease)    History reviewed. No pertinent past surgical history. Family History  Problem Relation Age of Onset  . Cancer Other    Social History  Substance Use Topics  . Smoking status: Never Smoker   . Smokeless tobacco: None  . Alcohol Use: No   OB History    Gravida Para Term Preterm AB TAB SAB Ectopic Multiple Living   1              Review of Systems  Constitutional: Negative for fever and chills.  Eyes: Positive for photophobia.  Negative for visual disturbance.  Respiratory: Negative.   Cardiovascular: Positive for near-syncope.  Gastrointestinal: Negative.  Negative for nausea and vomiting.  Genitourinary: Negative.  Negative for dysuria and vaginal bleeding.  Musculoskeletal: Negative.  Negative for gait problem and neck pain.  Skin: Negative.  Negative for wound.  Neurological: Positive for dizziness (none since yesterday), syncope (?full syncope vs near syncope last night) and headaches. Negative for weakness.      Allergies  Review of patient's allergies indicates no known allergies.  Home Medications   Prior to Admission medications   Medication Sig Start Date End Date Taking? Authorizing Provider  metroNIDAZOLE (FLAGYL) 500 MG tablet Take 1 tablet (500 mg total) by mouth 2 (two) times daily. 02/25/15   Harolyn Rutherford Rasch, NP   BP 133/86 mmHg  Pulse 90  Temp(Src) 98.6 F (37 C) (Oral)  Resp 18  SpO2 100%  LMP 06/01/2015 Physical Exam  Constitutional: She is oriented to person, place, and time. She appears well-developed and well-nourished.  HENT:  Head: Normocephalic.  Tender to occipital scalp without wound or significant hematoma.   Eyes: Pupils are equal, round, and reactive to light.  Neck: Normal range of motion. Neck supple.  Cardiovascular: Normal rate and regular rhythm.   Pulmonary/Chest: Effort normal and breath sounds normal.  Abdominal: Soft. Bowel sounds are normal. There is no tenderness. There is no rebound and no guarding.  Musculoskeletal: Normal range of motion.  No midline cervical tenderness.   Neurological: She is  alert and oriented to person, place, and time. She has normal strength and normal reflexes. No sensory deficit. She displays a negative Romberg sign. Coordination normal.  CN's 3-12 intact. Ambulatory without gait deficits. No abnormal coordination. Speech clear and focused. Alert, oriented, follows command.   Skin: Skin is warm and dry. No rash noted.   Psychiatric: She has a normal mood and affect.    ED Course  Procedures (including critical care time) Labs Review Labs Reviewed  CBC - Abnormal; Notable for the following:    WBC 11.6 (*)    All other components within normal limits  URINALYSIS, ROUTINE W REFLEX MICROSCOPIC (NOT AT [facility]) - Abnormal; Notable for the following:    APPearance CLOUDY (*)    Hgb urine dipstick LARGE (*)    Leukocytes, UA TRACE (*)    All other components within normal limits  URINE MICROSCOPIC-ADD ON - Abnormal; Notable for the following:    Squamous Epithelial / LPF 6-30 (*)    Bacteria, UA MANY (*)    All other components within normal limits  BASIC METABOLIC PANEL  I-STAT BETA HCG BLOOD, ED ([facility], [facility], [facility] ONLY)  CBG MONITORING, ED    Imaging Review No results found. I have personally reviewed and evaluated these images and lab results as part of my medical decision-making.   EKG Interpretation None      MDM   Final diagnoses:  None    1. Headache, post-concussive  The patient has remained alert and oriented throughout ED visit. VSS. CT performed given history of present photophobia, and is negative. Pain is resolved with medications in the ED. She is stable for discharge home.     Elpidio AnisShari Loran Fleet, PA-C 06/18/15 0104  Laurence Spatesachel Morgan Little, MD 06/21/15 [phone]

## 2015-06-17 NOTE — Discharge Instructions (Signed)

## 2015-10-24 ENCOUNTER — Encounter (HOSPITAL_COMMUNITY): Payer: Self-pay | Admitting: *Deleted

## 2015-10-24 ENCOUNTER — Emergency Department (HOSPITAL_COMMUNITY)
Admission: EM | Admit: 2015-10-24 | Discharge: 2015-10-24 | Disposition: A | Discharge status: Home / Self Care | Payer: Self-pay | Attending: Emergency Medicine | Admitting: Emergency Medicine

## 2015-10-24 ENCOUNTER — Emergency Department (HOSPITAL_COMMUNITY): Payer: Self-pay

## 2015-10-24 DIAGNOSIS — Y9289 Other specified places as the place of occurrence of the external cause: Secondary | ICD-10-CM | POA: Insufficient documentation

## 2015-10-24 DIAGNOSIS — S39012A Strain of muscle, fascia and tendon of lower back, initial encounter: Secondary | ICD-10-CM | POA: Insufficient documentation

## 2015-10-24 DIAGNOSIS — Y999 Unspecified external cause status: Secondary | ICD-10-CM | POA: Insufficient documentation

## 2015-10-24 DIAGNOSIS — Y939 Activity, unspecified: Secondary | ICD-10-CM | POA: Insufficient documentation

## 2015-10-24 DIAGNOSIS — W108XXA Fall (on) (from) other stairs and steps, initial encounter: Secondary | ICD-10-CM | POA: Insufficient documentation

## 2015-10-24 MED ORDER — LIDOCAINE 5 % EX PTCH
1.0000 | MEDICATED_PATCH | CUTANEOUS | Status: DC
  Administered 2015-10-24: 1 via TRANSDERMAL
  Filled 2015-10-24: qty 1

## 2015-10-24 MED ORDER — METHOCARBAMOL 500 MG PO TABS: 500.0000 mg | ORAL_TABLET | Freq: Two times a day (BID) | ORAL | 0 refills | Status: DC

## 2015-10-24 MED ORDER — KETOROLAC TROMETHAMINE 60 MG/2ML IM SOLN
30.0000 mg | Freq: Once | INTRAMUSCULAR | Status: AC
  Administered 2015-10-24: 30 mg via INTRAMUSCULAR
  Filled 2015-10-24: qty 2

## 2015-10-24 NOTE — Discharge Instructions (Signed)

## 2015-10-24 NOTE — ED Notes (Signed)
Fell 2-3 weeks ago. Has had continued and worsening LBP since then.

## 2015-10-24 NOTE — ED Triage Notes (Signed)
Pt reports slipping down steps two weeks ago and having mid to lower back pain since. Denies urinary symptoms.

## 2015-10-24 NOTE — ED Notes (Signed)
MED ORDERED FROM PHARMACY.

## 2015-10-24 NOTE — ED Provider Notes (Signed)
[facility]-EMER[facility]ENCY DEPT Provider Note   CSN: [id number] Arrival date & time: 10/24/15  1248  First Provider Contact:  First MD Initiated Contact with Patient 10/24/15 1300    By signing my name below, I, Sabrina Hahn, attest that this documentation has been prepared under the direction and in the presence of [employer], PA-C.  Electronically Signed: Rosario Hahn, ED Scribe. 10/24/15. 1:10 PM.  History   Chief Complaint Chief Complaint  Patient presents with  . Back Pain   HPI HPI Comments: Sabrina Hahn is a 20 y.o. female who presents to the Emergency Department complaining of sudden onset, unchanged, constant, moderate lower back pain onset ~2 weeks ago. Pt reports that she slipped down a flight of wooden stairs ~2 weeks ago, landing in a sitting position and striking the lower portion of her back, sustaining her pain since. Pt has been taking Ibuprofen and Advil which she states will moderately relieve her pain for ~1 hour, and then notes that her pain will return shortly after. Her pain is worsened with movement of her back. Pt has been ambulatory since the incident, but states that she has an antalgic, "hunched" gait due to her pain. Pt has implanted Nexplanon, and has regular menstrual periods every month. LNMP: 10/07/15. Denies bowel/bladder incontinence, rib pain, SOB, weakness, numbness, or any other associated symptoms.  Past Medical History:  Diagnosis Date  . PID (acute pelvic inflammatory disease)    There are no active problems to display for this patient.  History reviewed. No pertinent surgical history.  OB History    Gravida Para Term Preterm AB Living   1             SAB TAB Ectopic Multiple Live Births                   Home Medications    Prior to Admission medications   Medication Sig Start Date End Date Taking? Authorizing Provider  etonogestrel (NEXPLANON) 68 MG IMPL implant 1 each by Subdermal route once.    Historical Provider, MD    traMADol (ULTRAM) 50 MG tablet Take 1 tablet (50 mg total) by mouth every 6 (six) hours as needed. 06/17/15   Elpidio Anis, PA-C    Family History Family History  Problem Relation Age of Onset  . Cancer Other     Social History Social History  Substance Use Topics  . Smoking status: Never Smoker  . Smokeless tobacco: Not on file  . Alcohol use No   Allergies   Review of patient's allergies indicates no known allergies.  Review of Systems Review of Systems A complete 10 system review of systems was obtained and all systems are negative except as noted in the HPI and PMH.   Physical Exam Updated Vital Signs BP 121/70 (BP Location: Right Arm)   Pulse 88   Temp 97.6 F (36.4 C) (Oral)   Resp 18   LMP 10/07/2015   SpO2 100%   Physical Exam  Constitutional: She appears well-developed and well-nourished.  HENT:  Head: Normocephalic.  Eyes: Conjunctivae are normal.  Neck: Normal range of motion.  Cardiovascular: Normal rate, regular rhythm and intact distal pulses.   Pulmonary/Chest: Effort normal. No respiratory distress.  Abdominal: Soft. She exhibits no distension. There is no tenderness.  Musculoskeletal: Normal range of motion.  Neurological: She is alert.  No point tenderness to percussion of lumbar spinal processes.  No TTP or paraspinal muscular spasm. Strength is 5 out of 5  to bilateral lower extremities at hip and knee; extensor hallucis longus 5 out of 5. Ankle strength 5 out of 5, no clonus, neurovascularly intact. No saddle anaesthesia. Patellar reflexes are 2+ bilaterally.    Ambulates with a coordinated and nonantalgic gait.   Skin: Skin is warm and dry.  Psychiatric: She has a normal mood and affect. Her behavior is normal.  Nursing note and vitals reviewed.  ED Treatments / Results  DIAGNOSTIC STUDIES: Oxygen Saturation is 100% on RA, normal by my interpretation.   COORDINATION OF CARE: 1:08 PM-Discussed next steps with pt. Pt verbalized  understanding and is agreeable with the plan.   Radiology No results found.  Procedures Procedures (including critical care time)  Medications Ordered in ED Medications - No data to display   Initial Impression / Assessment and Plan / ED Course  I have reviewed the triage vital signs and the nursing notes.  Pertinent labs & imaging results that were available during my care of the patient were reviewed by me and considered in my medical decision making (see chart for details).  Clinical Course    Vitals:   10/24/15 1258  BP: 121/70  Pulse: 88  Resp: 18  Temp: 97.6 F (36.4 C)  TempSrc: Oral  SpO2: 100%    Medications  lidocaine (LIDODERM) 5 % 1 patch (1 patch Transdermal Patch Applied 10/24/15 1354)  ketorolac (TORADOL) injection 30 mg (30 mg Intramuscular Given 10/24/15 1340)    Sabrina Hahn is 20 y.o. female presenting with Low back pain, she had a slip and fall down just a few steps they were wooden several weeks ago. No focal bony tenderness, patient is ambulatory but pain has been persistent for several weeks, will obtain x-ray and give Lidoderm patch and Toradol.  Evaluation does not show pathology that would require ongoing emergent intervention or inpatient treatment. Pt is hemodynamically stable and mentating appropriately. Discussed findings and plan with patient/guardian, who agrees with care plan. All questions answered. Return precautions discussed and outpatient follow up given.      Final Clinical Impressions(s) / ED Diagnoses   Final diagnoses:  Low back strain, initial encounter    New Prescriptions Discharge Medication List as of 10/24/2015  2:34 PM    START taking these medications   Details  methocarbamol (ROBAXIN) 500 MG tablet Take 1 tablet (500 mg total) by mouth 2 (two) times daily., Starting Sun 10/24/2015, Print       I personally performed the services described in this documentation, which was scribed in my presence. The recorded  information has been reviewed and is accurate.     Wynetta Emeryicole Kenly Henckel, PA-C 10/24/15 1538    Vanetta MuldersScott Zackowski, MD 10/25/15 [phone]

## 2015-10-24 NOTE — ED Notes (Signed)
Patient transported to X-ray 

## 2016-04-18 ENCOUNTER — Emergency Department (HOSPITAL_COMMUNITY)
Admission: EM | Admit: 2016-04-18 | Discharge: 2016-04-18 | Disposition: A | Discharge status: Home / Self Care | Payer: Self-pay | Attending: Emergency Medicine | Admitting: Emergency Medicine

## 2016-04-18 ENCOUNTER — Encounter (HOSPITAL_COMMUNITY): Payer: Self-pay | Admitting: Emergency Medicine

## 2016-04-18 DIAGNOSIS — Z5321 Procedure and treatment not carried out due to patient leaving prior to being seen by health care provider: Secondary | ICD-10-CM | POA: Insufficient documentation

## 2016-04-18 DIAGNOSIS — R109 Unspecified abdominal pain: Secondary | ICD-10-CM | POA: Insufficient documentation

## 2016-04-18 LAB — URINALYSIS, ROUTINE W REFLEX MICROSCOPIC
BILIRUBIN URINE: NEGATIVE
Glucose, UA: NEGATIVE mg/dL
KETONES UR: NEGATIVE mg/dL
LEUKOCYTES UA: NEGATIVE
Nitrite: NEGATIVE
PH: 5 (ref 5.0–8.0)
PROTEIN: NEGATIVE mg/dL
Specific Gravity, Urine: 1.029 (ref 1.005–1.030)

## 2016-04-18 LAB — CBC
HEMATOCRIT: 39.8 % (ref 36.0–46.0)
Hemoglobin: 13.2 g/dL (ref 12.0–15.0)
MCH: 28.3 pg (ref 26.0–34.0)
MCHC: 33.2 g/dL (ref 30.0–36.0)
MCV: 85.4 fL (ref 78.0–100.0)
PLATELETS: 292 10*3/uL (ref 150–400)
RBC: 4.66 MIL/uL (ref 3.87–5.11)
RDW: 14.9 % (ref 11.5–15.5)
WBC: 12 10*3/uL — ABNORMAL HIGH (ref 4.0–10.5)

## 2016-04-18 LAB — COMPREHENSIVE METABOLIC PANEL
ALT: UNDETERMINED U/L (ref 14–54)
AST: UNDETERMINED U/L (ref 15–41)
Albumin: 3.8 g/dL (ref 3.5–5.0)
Alkaline Phosphatase: 45 U/L (ref 38–126)
Anion gap: 9 (ref 5–15)
BUN: 11 mg/dL (ref 6–20)
CHLORIDE: 109 mmol/L (ref 101–111)
CO2: 21 mmol/L — AB (ref 22–32)
CREATININE: 0.92 mg/dL (ref 0.44–1.00)
Calcium: 9.2 mg/dL (ref 8.9–10.3)
GFR calc Af Amer: >60 mL/min (ref >60)
GFR calc non Af Amer: >60 mL/min (ref >60)
Glucose, Bld: 83 mg/dL (ref 65–99)
POTASSIUM: 4.5 mmol/L (ref 3.5–5.1)
SODIUM: 139 mmol/L (ref 135–145)
Total Bilirubin: UNDETERMINED mg/dL (ref 0.3–1.2)
Total Protein: 6.9 g/dL (ref 6.5–8.1)

## 2016-04-18 LAB — I-STAT BETA HCG BLOOD, ED (MC, WL, AP ONLY)

## 2016-04-18 LAB — LIPASE, BLOOD: LIPASE: 31 U/L (ref 11–51)

## 2016-04-18 NOTE — ED Notes (Addendum)
Called pt, no answer.

## 2016-04-18 NOTE — ED Notes (Signed)
Called patient in waiting room no answer.  

## 2016-04-18 NOTE — ED Triage Notes (Signed)
Cramps and abd pain x 1 week states that she had a period in dec but this one has just started and it it just a little , has had some nausea and diaRRHEA

## 2016-05-15 ENCOUNTER — Ambulatory Visit: Payer: Self-pay

## 2016-05-22 ENCOUNTER — Ambulatory Visit: Payer: Self-pay

## 2017-01-12 IMAGING — US US ART/VEN ABD/PELV/SCROTUM DOPPLER LTD
1 series · 13 of 25 positions shown · non-contrast
Comparison: 02/28/2014

CLINICAL DATA: Pelvic pain.

EXAM:
TRANSABDOMINAL AND TRANSVAGINAL ULTRASOUND OF PELVIS
DOPPLER ULTRASOUND OF OVARIES
TECHNIQUE: Both transabdominal and transvaginal ultrasound examinations of the
pelvis were performed. Transabdominal technique was performed for
global imaging of the pelvis including uterus, ovaries, adnexal
regions, and pelvic cul-de-sac.
It was necessary to proceed with endovaginal exam following the
transabdominal exam to visualize the uterus, ovaries, and adnexa.
Color and duplex Doppler ultrasound was utilized to evaluate blood
flow to the ovaries.

[Series 1: us art/ven abd/pelv/scrotum doppler ltd · 0.21mm/px · 13 of 44 slices shown]
[im 1/44]
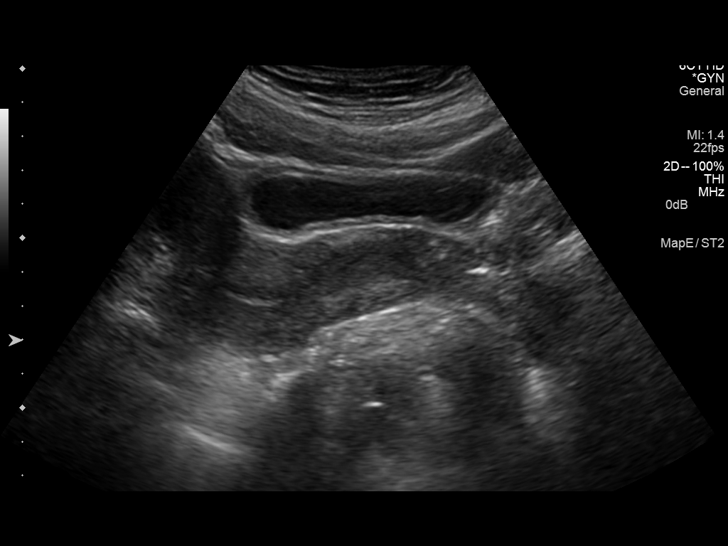
[im 4/44]
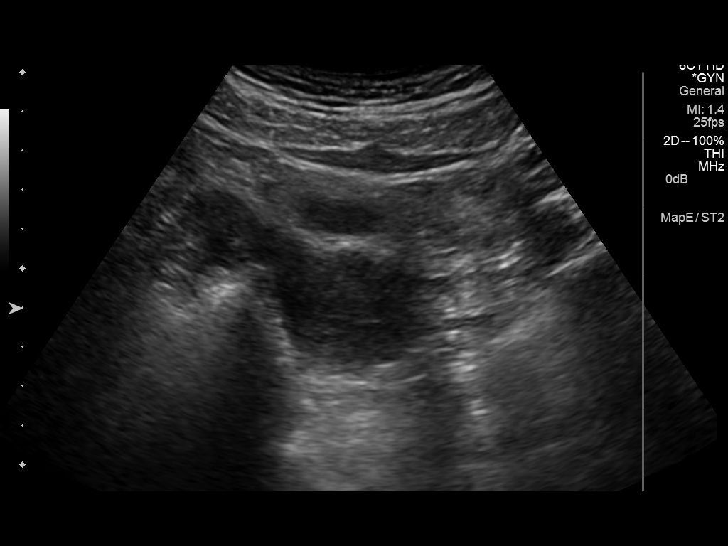
[im 8/44]
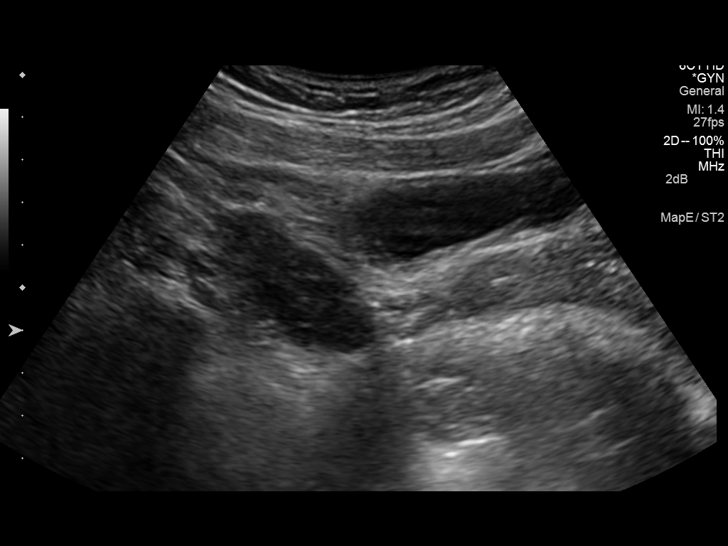
[im 11/44]
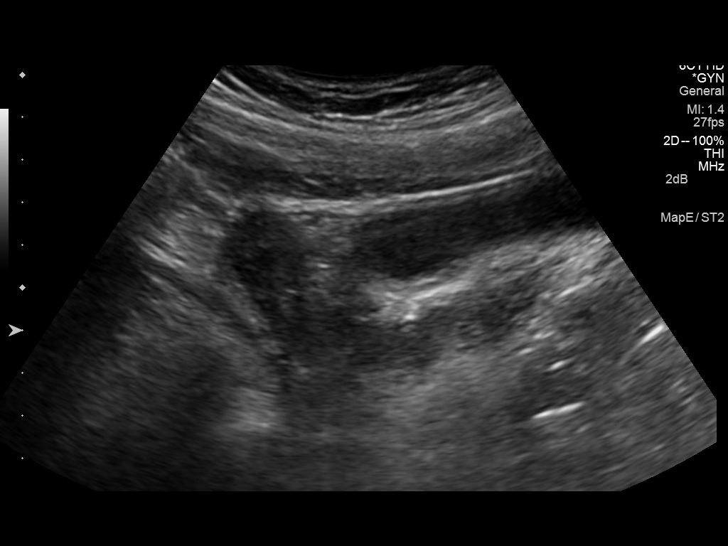
[im 15/44]
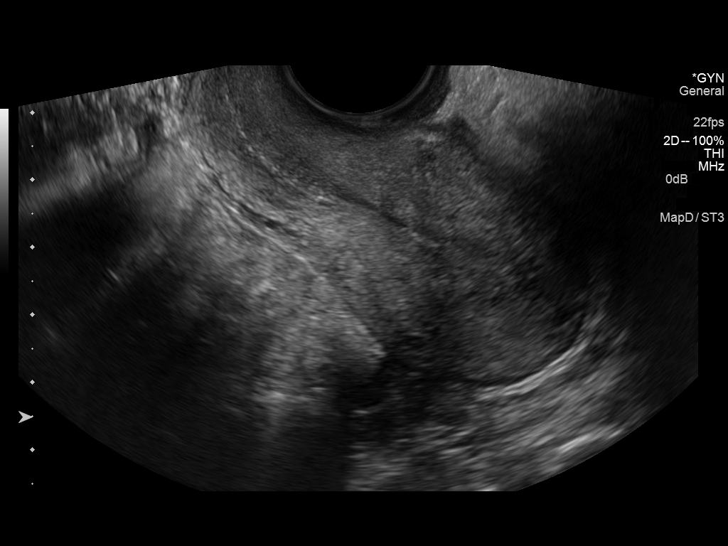
[im 18/44]
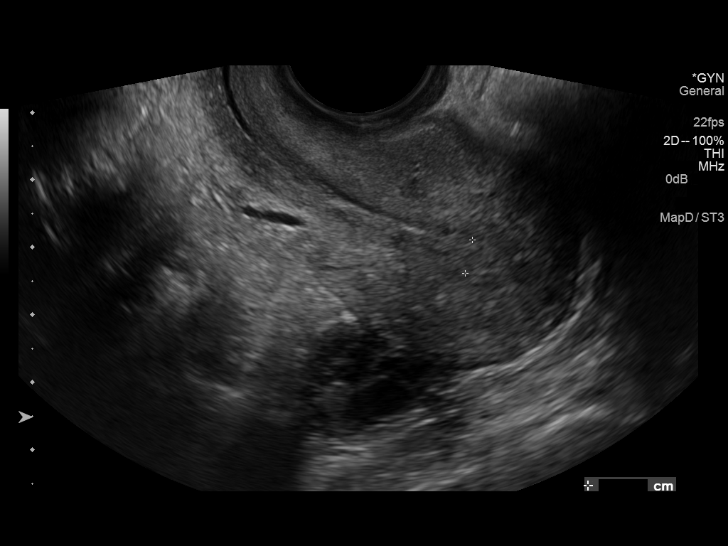
[im 22/44]
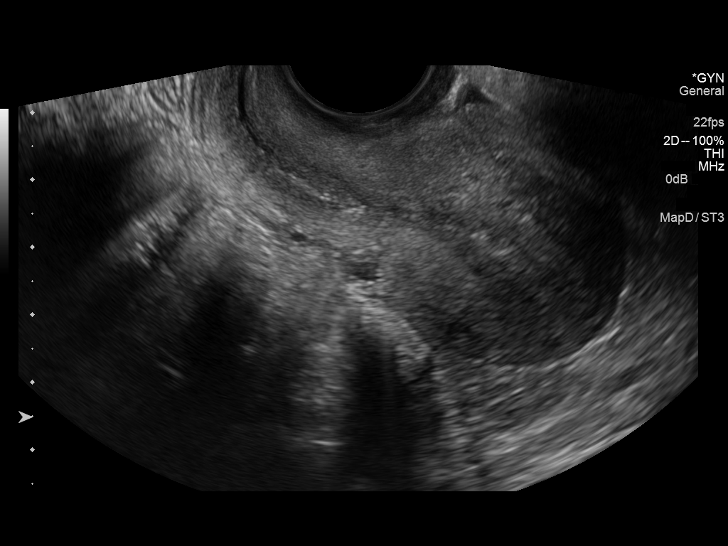
[im 26/44]
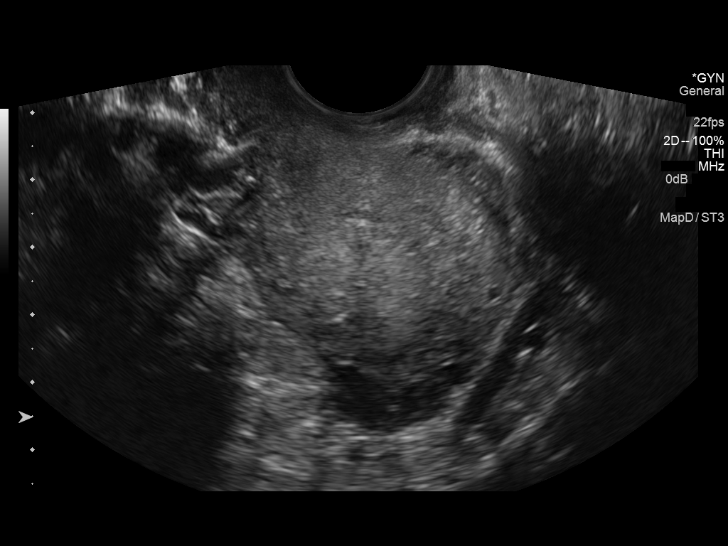
[im 29/44]
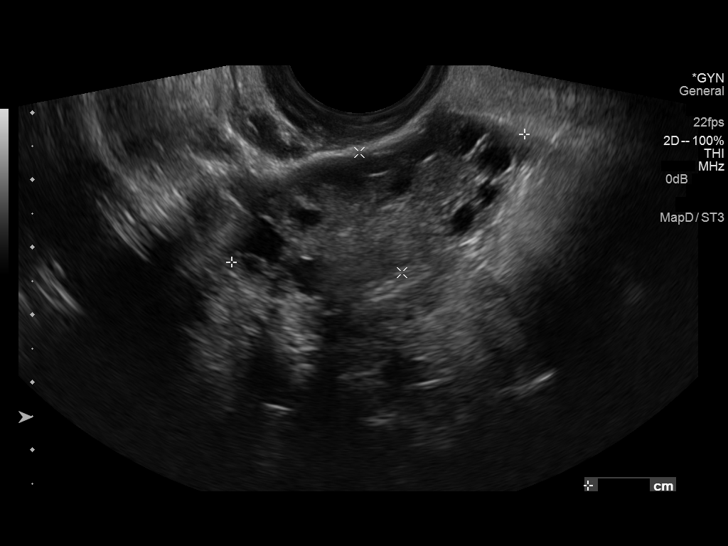
[im 33/44]
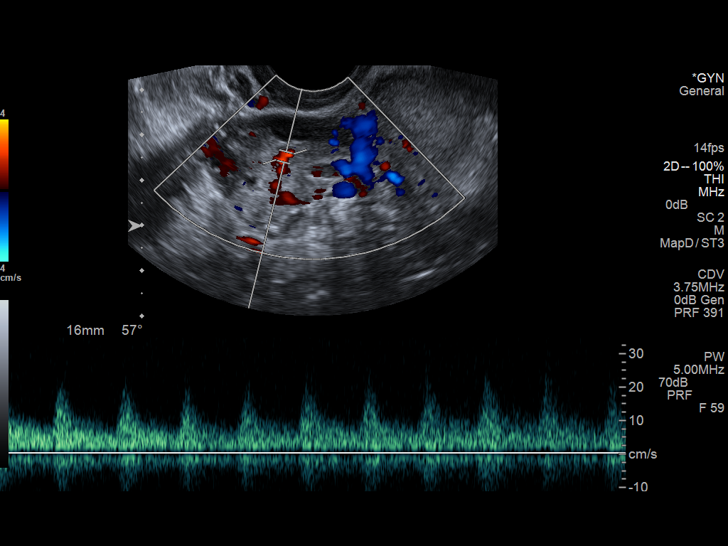
[im 36/44]
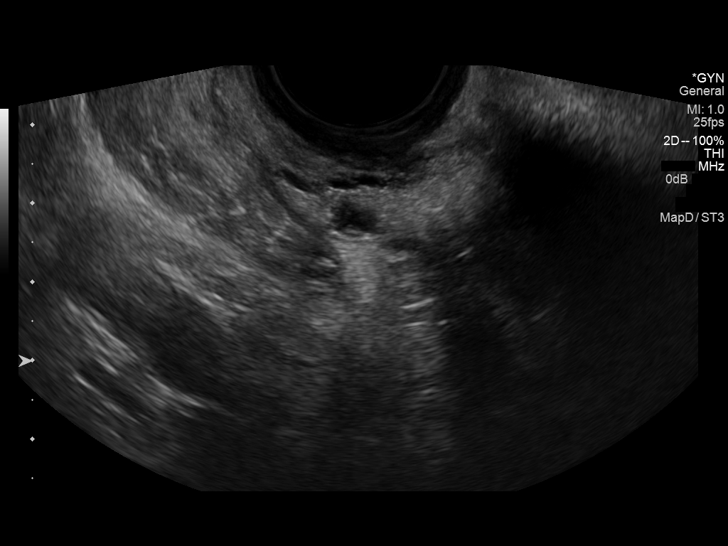
[im 40/44]
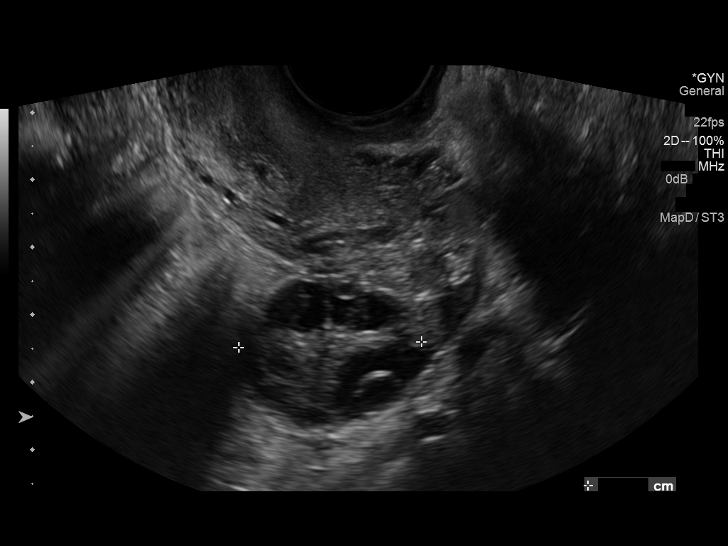
[im 44/44]
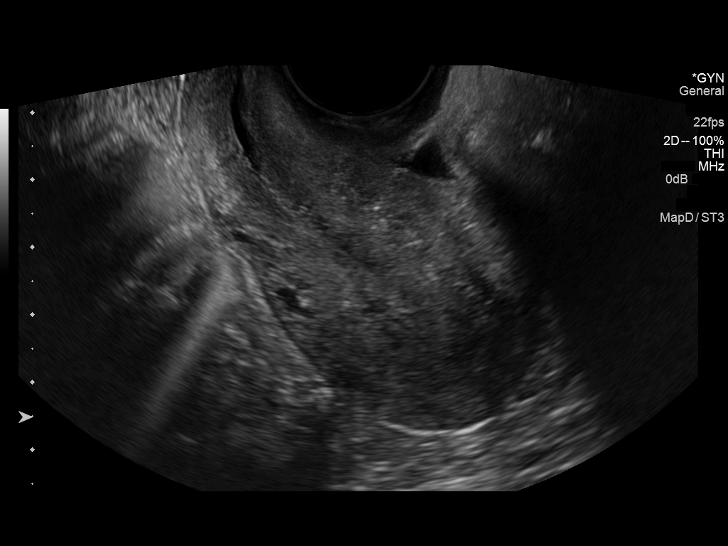

[13 of 25 positions shown; findings below may reference images not displayed]

FINDINGS: Uterus

Measurements: 6.9 x 3.4 x 4.1 cm. No fibroids or other mass
visualized. Retroverted.

Endometrium

Thickness: 5.1 cm.  No focal abnormality visualized.

Right ovary

Measurements: 4.7 x 1.9 x 2.4 cm. Normal appearance/no adnexal mass.

Left ovary

Measurements: 3.7 x 2.2 x 2.7 cm. Normal appearance/no adnexal mass.

Pulsed Doppler evaluation of both ovaries demonstrates normal
low-resistance arterial and venous waveforms.

Other findings

Trace free pelvic fluid is likely physiologic.
IMPRESSION: Normal pelvic ultrasound for age. No explanation for pelvic pain. No
evidence of ovarian or adnexal torsion.

## 2017-02-13 ENCOUNTER — Other Ambulatory Visit: Payer: Self-pay

## 2017-02-13 ENCOUNTER — Inpatient Hospital Stay (HOSPITAL_COMMUNITY)
Admission: AD | Admit: 2017-02-13 | Discharge: 2017-02-13 | Disposition: A | Discharge status: Home / Self Care | Payer: Self-pay | Source: Ambulatory Visit | Attending: Obstetrics & Gynecology | Admitting: Obstetrics & Gynecology

## 2017-02-13 ENCOUNTER — Encounter (HOSPITAL_COMMUNITY): Payer: Self-pay | Admitting: *Deleted

## 2017-02-13 DIAGNOSIS — N926 Irregular menstruation, unspecified: Secondary | ICD-10-CM | POA: Insufficient documentation

## 2017-02-13 HISTORY — DX: Other specified health status: Z78.9

## 2017-02-13 LAB — URINALYSIS, ROUTINE W REFLEX MICROSCOPIC
BILIRUBIN URINE: NEGATIVE
Bacteria, UA: NONE SEEN
GLUCOSE, UA: NEGATIVE mg/dL
KETONES UR: 80 mg/dL — AB
LEUKOCYTES UA: NEGATIVE
Nitrite: NEGATIVE
PH: 5 (ref 5.0–8.0)
Protein, ur: 30 mg/dL — AB
Specific Gravity, Urine: 1.029 (ref 1.005–1.030)

## 2017-02-13 LAB — POCT PREGNANCY, URINE: Preg Test, Ur: NEGATIVE

## 2017-02-13 NOTE — Discharge Instructions (Signed)
Dysfunctional Uterine Bleeding °Dysfunctional uterine bleeding is abnormal bleeding from the uterus. Dysfunctional uterine bleeding includes: °· A period that comes earlier or later than usual. °· A period that is lighter, heavier, or has blood clots. °· Bleeding between periods. °· Skipping one or more periods. °· Bleeding after sexual intercourse. °· Bleeding after menopause. ° °Follow these instructions at home: °Pay attention to any changes in your symptoms. Follow these instructions to help with your condition: °Eating and drinking °· Eat well-balanced meals. Include foods that are high in iron, such as liver, meat, shellfish, green leafy vegetables, and eggs. °· If you become constipated: °? Drink plenty of water. °? Eat fruits and vegetables that are high in water and fiber, such as spinach, carrots, raspberries, apples, and mango. °Medicines °· Take over-the-counter and prescription medicines only as told by your health care provider. °· Do not change medicines without talking with your health care provider. °· Aspirin or medicines that contain aspirin may make the bleeding worse. Do not take those medicines: °? During the week before your period. °? During your period. °· If you were prescribed iron pills, take them as told by your health care provider. Iron pills help to replace iron that your body loses because of this condition. °Activity °· If you need to change your sanitary pad or tampon more than one time every 2 hours: °? Lie in bed with your feet raised (elevated). °? Place a cold pack on your lower abdomen. °? Rest as much as possible until the bleeding stops or slows down. °· Do not try to lose weight until the bleeding has stopped and your blood iron level is back to normal. °Other Instructions °· For two months, write down: °? When your period starts. °? When your period ends. °? When any abnormal bleeding occurs. °? What problems you notice. °· Keep all follow up visits as told by your health  care provider. This is important. °Contact a health care provider if: °· You get light-headed or weak. °· You have nausea and vomiting. °· You cannot eat or drink without vomiting. °· You feel dizzy or have diarrhea while you are taking medicines. °· You are taking birth control pills or hormones, and you want to change them or stop taking them. °Get help right away if: °· You develop a fever or chills. °· You need to change your sanitary pad or tampon more than one time per hour. °· Your bleeding becomes heavier, or your flow contains clots more often. °· You develop pain in your abdomen. °· You lose consciousness. °· You develop a rash. °This information is not intended to replace advice given to you by your health care provider. Make sure you discuss any questions you have with your health care provider. °Document Released: 03/03/2000 Document Revised: 08/12/2015 Document Reviewed: 06/01/2014 °Elsevier Interactive Patient Education © 2018 [employer] ° °

## 2017-02-13 NOTE — Progress Notes (Signed)
D/c instructions reviewed, given & signed for. Pt verb understanding & d/c in stable condition.

## 2017-02-13 NOTE — MAU Note (Signed)
Period is 10 days late.  Started spotting yesterday. Cramping for over a wk, only on left side.   Hx of ectopic preg, received MTX.

## 2017-02-13 NOTE — MAU Provider Note (Signed)
Patient Sabrina Hahn is a 21 y.o. [id number]  Female here concerned for ectopic pregnancy. She was late on her period this month, and now has some bleeding and cramping. She denies other ob-gyn complaint. She has not taken a home pregnancy test.   She says she was diagnosed with PID in the emergency room but has no history of Gonorrhea or Chlamydia. Does not want STI testing or a wet prep now.  History     CSN: [id number]  Arrival date and time: 02/13/17 1439   None     Chief Complaint  Patient presents with  . Abdominal Pain  . Possible Pregnancy  . Vaginal Bleeding   Vaginal Bleeding  The patient's primary symptoms include vaginal bleeding. The patient's pertinent negatives include no genital itching, genital lesions, genital odor, genital rash, missed menses or pelvic pain. This is a new problem. The current episode started today. The problem occurs intermittently. The problem has been unchanged. The pain is mild. Pertinent negatives include no back pain, chills or constipation. The vaginal discharge was bloody. The vaginal bleeding is typical of menses. She has not been passing clots. Nothing aggravates the symptoms. She has tried nothing for the symptoms.  She says that her period is always regular; it has not been late like this before. Patient also has some mild cramping on left side of her abdomen. She has not tried anything for the pain. She rates it as "mild"; it is not made better or worse by anything.     OB History    Gravida Para Term Preterm AB Living   1       1 0   SAB TAB Ectopic Multiple Live Births       1          Past Medical History:  Diagnosis Date  . Medical history non-contributory   . PID (acute pelvic inflammatory disease)     Past Surgical History:  Procedure Laterality Date  . NO PAST SURGERIES      Family History  Problem Relation Age of Onset  . Cancer Other     Social History   Tobacco Use  . Smoking status: Never Smoker  .  Smokeless tobacco: Never Used  Substance Use Topics  . Alcohol use: No  . Drug use: No    Allergies: No Known Allergies  Medications Prior to Admission  Medication Sig Dispense Refill Last Dose  . etonogestrel (NEXPLANON) 68 MG IMPL implant 1 each by Subdermal route once.   06/17/2015 at Unknown time  . methocarbamol (ROBAXIN) 500 MG tablet Take 1 tablet (500 mg total) by mouth 2 (two) times daily. 20 tablet 0   . traMADol (ULTRAM) 50 MG tablet Take 1 tablet (50 mg total) by mouth every 6 (six) hours as needed. 15 tablet 0     Review of Systems  Constitutional: Negative for chills.  Gastrointestinal: Negative for constipation.  Genitourinary: Positive for vaginal bleeding. Negative for missed menses and pelvic pain.  Musculoskeletal: Negative for back pain.   Physical Exam   Blood pressure 115/86, pulse 76, temperature 98.2 F (36.8 C), temperature source Oral, resp. rate 16, weight 112 lb 2 oz (50.9 kg), last menstrual period 01/06/2017, SpO2 100 %, unknown if currently breastfeeding.  Physical Exam  Constitutional: She appears well-developed.  HENT:  Head: Normocephalic.  Neck: Normal range of motion.  GI: Soft. She exhibits no mass. There is tenderness. There is no rebound and no guarding.  Musculoskeletal: Normal range of  motion.  Neurological: She is alert.  Skin: Skin is warm and dry.  Psychiatric: She has a normal mood and affect.  Patient did not want speculum exam; bimanual performed. Trace blood on exam blood; no CMT, suprapubic or adnexal tenderness. NEFG.   MAU Course  Procedures  MDM Bimanual, pregnancy test negative.   Assessment and Plan   1. Menses, irregular    2. Talked with patient about importance of birth control to prevent pregnancy; emphasized that not all menses will be the same and that if she is late on her period she should take a pregnancy test at home and then two weeks later. It is not appropriate to come to the MAU for a pregnancy test.   3. Reviewed ectopic precautions in the setting of a positive pregnancy test.  4. Patient verbalized understanding and will follow-up with her ob.   Charlesetta GaribaldiKathryn Lorraine Lakesia Dahle 02/13/2017, 3:46 PM

## 2017-03-05 ENCOUNTER — Other Ambulatory Visit: Payer: Self-pay

## 2017-03-05 ENCOUNTER — Emergency Department (HOSPITAL_COMMUNITY)
Admission: EM | Admit: 2017-03-05 | Discharge: 2017-03-05 | Disposition: A | Discharge status: Home / Self Care | Payer: Self-pay | Attending: Emergency Medicine | Admitting: Emergency Medicine

## 2017-03-05 ENCOUNTER — Encounter (HOSPITAL_COMMUNITY): Payer: Self-pay | Admitting: Emergency Medicine

## 2017-03-05 DIAGNOSIS — K130 Diseases of lips: Secondary | ICD-10-CM | POA: Insufficient documentation

## 2017-03-05 DIAGNOSIS — Z79899 Other long term (current) drug therapy: Secondary | ICD-10-CM | POA: Insufficient documentation

## 2017-03-05 DIAGNOSIS — R0981 Nasal congestion: Secondary | ICD-10-CM | POA: Insufficient documentation

## 2017-03-05 DIAGNOSIS — F419 Anxiety disorder, unspecified: Secondary | ICD-10-CM | POA: Insufficient documentation

## 2017-03-05 DIAGNOSIS — R220 Localized swelling, mass and lump, head: Secondary | ICD-10-CM

## 2017-03-05 MED ORDER — CEPHALEXIN 250 MG PO CAPS: 250.0000 mg | ORAL_CAPSULE | Freq: Four times a day (QID) | ORAL | 0 refills | Status: DC

## 2017-03-05 MED ORDER — SODIUM CHLORIDE 0.9 % IV BOLUS (SEPSIS)
1000.0000 mL | Freq: Once | INTRAVENOUS | Status: AC
  Administered 2017-03-05: 1000 mL via INTRAVENOUS

## 2017-03-05 NOTE — ED Provider Notes (Signed)
[facility]   CSN: [id number] Arrival date & time: 03/05/17  1348     History   Chief Complaint Chief Complaint  Patient presents with  . Oral Swelling    HPI Sabrina Hahn is a 21 y.o. female presenting with upper lip swelling.  Pt states that on Saturday, she woke up with central upper lip swelling with lesions at the midline.  She denies history of similar.  She denies lesions or swelling elsewhere.  She denies pain.  She denies drainage from the lesions.  She denies lesions in her mouth.  She denies swelling of her bottom lip, tongue, or throat.  She denies history of allergic reaction.  She has not taken anything for this.  Swelling has not improved over the past several days.  She denies fevers, chills, ear pain, eye pain, sore throat, cough, chest pain, shortness breath, nausea, or vomiting.  She reports nasal congestion earlier this week which is improving.  She denies sick contacts or contacts with similar lesions.   HPI  Past Medical History:  Diagnosis Date  . Medical history non-contributory   . PID (acute pelvic inflammatory disease)     There are no active problems to display for this patient.   Past Surgical History:  Procedure Laterality Date  . NO PAST SURGERIES      OB History    Gravida Para Term Preterm AB Living   1       1 0   SAB TAB Ectopic Multiple Live Births       1           Home Medications    Prior to Admission medications   Medication Sig Start Date End Date Taking? Authorizing Provider  ibuprofen (ADVIL,MOTRIN) 200 MG tablet Take 600 mg by mouth every 6 (six) hours as needed for moderate pain.   Yes [provider]  Multiple Vitamin (MULTIVITAMIN WITH MINERALS) TABS tablet Take 1 tablet by mouth daily.   Yes [provider]  cephALEXin (KEFLEX) 250 MG capsule Take 1 capsule (250 mg total) by mouth 4 (four) times daily. 03/05/17   Azrielle Springsteen, PA-C     Family History Family History  Problem Relation Age of Onset  . Cancer Other     Social History Social History   Tobacco Use  . Smoking status: Never Smoker  . Smokeless tobacco: Never Used  Substance Use Topics  . Alcohol use: No  . Drug use: No     Allergies   Patient has no known allergies.   Review of Systems Review of Systems  Constitutional: Negative for chills and fever.  HENT: Negative for mouth sores, sore throat, trouble swallowing and voice change.        Upper lip swelling and lesions     Physical Exam Updated Vital Signs BP 110/67   Pulse 90   Temp 98.8 F (37.1 C) (Oral)   Resp 20   SpO2 100%   Physical Exam  Constitutional: She is oriented to person, place, and time. She appears well-developed and well-nourished. No distress.  HENT:  Head: Normocephalic and atraumatic.  Nose: Nose normal. Right sinus exhibits no maxillary sinus tenderness and no frontal sinus tenderness. Left sinus exhibits no maxillary sinus tenderness and no frontal sinus tenderness.  Mouth/Throat: Uvula is midline and oropharynx is clear and moist.    Swelling and lesions of upper lip.  No lower lip swelling.  No tongue or throat swelling.  Patient handling secretions easily.  Eyes: Conjunctivae and EOM are normal. Pupils are equal, round, and reactive to light.  Neck: Normal range of motion.  Cardiovascular: Regular rhythm and intact distal pulses.  tachycardic  Pulmonary/Chest: Effort normal and breath sounds normal. No respiratory distress. She has no wheezes.  Abdominal: Soft. She exhibits no distension. There is no tenderness.  Musculoskeletal: Normal range of motion.  Lymphadenopathy:    She has no cervical adenopathy.  Neurological: She is alert and oriented to person, place, and time.  Skin: Skin is warm. No rash noted.  Psychiatric: She has a normal mood and affect.  Nursing note and vitals reviewed.    ED Treatments / Results  Labs (all labs ordered  are listed, but only abnormal results are displayed) Labs Reviewed  HSV CULTURE AND TYPING    EKG  EKG Interpretation None       Radiology No results found.  Procedures Procedures (including critical care time)  Medications Ordered in ED Medications  sodium chloride 0.9 % bolus 1,000 mL (0 mLs Intravenous Stopped 03/05/17 1918)     Initial Impression / Assessment and Plan / ED Course  I have reviewed the triage vital signs and the nursing notes.  Pertinent labs & imaging results that were available during my care of the patient were reviewed by me and considered in my medical decision making (see chart for details).     Patient presenting with upper lip swelling and lesions.  Physical exam shows she is tachycardic.  Lesions are honey crusted and not draining.  They are nonpainful.  No throat, tongue, lower lip swelling.  No difficulty maintaining airway. ?impetigo versus HSV.  Patient states she has not been drinking much over the past several days, she is feeling very anxious.  Will give bolus of fluid and recheck heart rate.  Lesions tested for HSV.  On reassessment, heart rate is improved.  Will discharge with antibiotics to cover for impetigo.  Patient is aware culture is pending.  At this time, patient appears safe for discharge.  Return precautions given.  Patient states she understands and agrees to plan.   Final Clinical Impressions(s) / ED Diagnoses   Final diagnoses:  Lip lesion  Lip swelling    ED Discharge Orders        Ordered    cephALEXin (KEFLEX) 250 MG capsule  4 times daily     03/05/17 1911       Alveria ApleyCaccavale, Willo Yoon, PA-C 03/06/17 0122    Rolan BuccoBelfi, Melanie, MD 03/06/17 1400

## 2017-03-05 NOTE — Discharge Instructions (Signed)
Take antibiotics as prescribed.  Take the entire course of antibiotics, even if your symptoms improve. Continue to use ibuprofen and ice for swelling. The test is running for the herpes virus.  If it is positive, you will receive a phone call. Return to the emergency room if you develop fevers, swelling of your tongue or throat, difficulty breathing, or any new or worsening symptoms.

## 2017-03-05 NOTE — ED Triage Notes (Signed)
Pt has had upper lip swelling since last sat , pt denies anything new , food s etc , pt han dling secreations well and pulse  Ox is good

## 2017-03-08 LAB — HSV CULTURE AND TYPING

## 2017-05-28 ENCOUNTER — Emergency Department (HOSPITAL_COMMUNITY): Payer: Medicaid Other

## 2017-05-28 ENCOUNTER — Encounter (HOSPITAL_COMMUNITY): Payer: Self-pay

## 2017-05-28 ENCOUNTER — Other Ambulatory Visit: Payer: Self-pay

## 2017-05-28 ENCOUNTER — Emergency Department (HOSPITAL_COMMUNITY)
Admission: EM | Admit: 2017-05-28 | Discharge: 2017-05-28 | Disposition: A | Discharge status: Home / Self Care | Payer: Medicaid Other | Attending: Emergency Medicine | Admitting: Emergency Medicine

## 2017-05-28 DIAGNOSIS — Z79899 Other long term (current) drug therapy: Secondary | ICD-10-CM | POA: Insufficient documentation

## 2017-05-28 DIAGNOSIS — J069 Acute upper respiratory infection, unspecified: Secondary | ICD-10-CM

## 2017-05-28 MED ORDER — HYDROCOD POLST-CPM POLST ER 10-8 MG/5ML PO SUER
5.0000 mL | Freq: Once | ORAL | Status: AC
  Administered 2017-05-28: 5 mL via ORAL
  Filled 2017-05-28: qty 5

## 2017-05-28 MED ORDER — BENZONATATE 100 MG PO CAPS: 100.0000 mg | ORAL_CAPSULE | Freq: Three times a day (TID) | ORAL | 0 refills | Status: DC

## 2017-05-28 MED ORDER — IPRATROPIUM-ALBUTEROL 0.5-2.5 (3) MG/3ML IN SOLN
3.0000 mL | Freq: Once | RESPIRATORY_TRACT | Status: AC
  Administered 2017-05-28: 3 mL via RESPIRATORY_TRACT
  Filled 2017-05-28: qty 3

## 2017-05-28 NOTE — ED Triage Notes (Signed)
Lung sounds clear on auscultation. Breathing better now that pt has calmed down.

## 2017-05-28 NOTE — ED Provider Notes (Signed)
Patient placed in Quick Look pathway, seen and evaluated   Chief Complaint: cough  HPI:   Pt presents to ED with cough and pain in her throat and chest with coughing. States symptoms began yesterday. Did not check temp at home. No nasal congestion. No sick contacts. States cannot stop coughing and cannot breath. Pt is crying, states she is scared   ROS: positive for cough, sob, dizziness Physical Exam:   Gen: No distress  Neuro: Awake and Alert  Skin: Warm    Focused Exam: pt is tearful, hyperventilating. Expiratory wheezes bilaterally. Tachycardic (crying.) No LE swelling. Coughing frequently.    Pt with cough, shortness of breath, chest pain which is there only with coughing. She is coughing frequently during exam, she is crying and hyperventilating. Asked to slow down breathing, camed her down some. Will get cxr and give duoneb for wheezing and tussionex for cough. No risk factors for PE. No fever here.   Vitals:   05/28/17 1458  BP: 103/84  Pulse: (!) 109  Resp: (!) 22  Temp: 98.6 F (37 C)  TempSrc: Oral  SpO2: 100%      Initiation of care has begun. The patient has been counseled on the process, plan, and necessity for staying for the completion/evaluation, and the remainder of the medical screening examination    Jaynie CrumbleKirichenko, Cormac Wint, Cordelia Poche-C 05/28/17 1513    Azalia Bilisampos, Kevin, MD 05/28/17 1730

## 2017-05-28 NOTE — ED Triage Notes (Signed)
Pt states she felt as though something was in her throat yesterday so she developed a cough and was drinking lots of water. Pt states today at work she began coughing worse.

## 2017-05-28 NOTE — Discharge Instructions (Signed)
Please read attached information. If you experience any new or worsening signs or symptoms please return to the emergency room for evaluation. Please follow-up with your primary care provider or specialist as discussed. Please use medication prescribed only as directed and discontinue taking if you have any concerning signs or symptoms.   °

## 2017-05-28 NOTE — ED Provider Notes (Signed)
[facility] Provider Note   CSN: [id number] Arrival date & time: 05/28/17  1450     History   Chief Complaint Chief Complaint  Patient presents with  . Cough    HPI Sabrina Hahn is a 22 y.o. female.  HPI    22 year old female presents today with upper respiratory infection.  Patient notes last night she developed a cough that worsened overnight.  Very minimal runny nose nasal congestion.  She denies any fever.  Patient reports chest soreness from coughing, denies any lower extremity swelling or edema, no medications prior to arrival. Non smoker    Past Medical History:  Diagnosis Date  . Medical history non-contributory   . PID (acute pelvic inflammatory disease)     There are no active problems to display for this patient.   Past Surgical History:  Procedure Laterality Date  . NO PAST SURGERIES      OB History    Gravida Para Term Preterm AB Living   1       1 0   SAB TAB Ectopic Multiple Live Births       1           Home Medications    Prior to Admission medications   Medication Sig Start Date End Date Taking? Authorizing Provider  benzonatate (TESSALON) 100 MG capsule Take 1 capsule (100 mg total) by mouth every 8 (eight) hours. 05/28/17   Marybell Robards, Tinnie GensJeffrey, PA-C  cephALEXin (KEFLEX) 250 MG capsule Take 1 capsule (250 mg total) by mouth 4 (four) times daily. 03/05/17   Caccavale, Sophia, PA-C  ibuprofen (ADVIL,MOTRIN) 200 MG tablet Take 600 mg by mouth every 6 (six) hours as needed for moderate pain.    [provider]  Multiple Vitamin (MULTIVITAMIN WITH MINERALS) TABS tablet Take 1 tablet by mouth daily.    [provider]    Family History Family History  Problem Relation Age of Onset  . Cancer Other     Social History Social History   Tobacco Use  . Smoking status: Never Smoker  . Smokeless tobacco: Never Used  Substance Use Topics  . Alcohol use: No  . Drug use: No      Allergies   Patient has no known allergies.   Review of Systems Review of Systems  All other systems reviewed and are negative.    Physical Exam Updated Vital Signs BP 103/84 (BP Location: Left Arm)   Pulse (!) 109   Temp 98.6 F (37 C) (Oral)   Resp (!) 22   SpO2 100%   Physical Exam  Constitutional: She is oriented to person, place, and time. She appears well-developed and well-nourished.  HENT:  Head: Normocephalic and atraumatic.  Eyes: Conjunctivae are normal. Pupils are equal, round, and reactive to light. Right eye exhibits no discharge. Left eye exhibits no discharge. No scleral icterus.  Neck: Normal range of motion. No JVD present. No tracheal deviation present.  Pulmonary/Chest: Effort normal. No stridor.  Neurological: She is alert and oriented to person, place, and time. Coordination normal.  Psychiatric: She has a normal mood and affect. Her behavior is normal. Judgment and thought content normal.  Nursing note and vitals reviewed.    ED Treatments / Results  Labs (all labs ordered are listed, but only abnormal results are displayed) Labs Reviewed - No data to display  EKG  EKG Interpretation None       Radiology Dg Chest 2 View  Result Date: 05/28/2017  CLINICAL DATA:  Cough and chest pain EXAM: CHEST - 2 VIEW COMPARISON:  11/04/2013 FINDINGS: The heart size and mediastinal contours are within normal limits. Both lungs are clear. The visualized skeletal structures are unremarkable. IMPRESSION: No active cardiopulmonary disease. Electronically Signed   By: Alcide Clever M.D.   On: 05/28/2017 15:43    Procedures Procedures (including critical care time)  Medications Ordered in ED Medications  chlorpheniramine-HYDROcodone (TUSSIONEX) 10-8 MG/5ML suspension 5 mL (5 mLs Oral Given 05/28/17 1611)  ipratropium-albuterol (DUONEB) 0.5-2.5 (3) MG/3ML nebulizer solution 3 mL (3 mLs Nebulization Given 05/28/17 1611)     Initial Impression /  Assessment and Plan / ED Course  I have reviewed the triage vital signs and the nursing notes.  Pertinent labs & imaging results that were available during my care of the patient were reviewed by me and considered in my medical decision making (see chart for details).      Final Clinical Impressions(s) / ED Diagnoses   Final diagnoses:  Viral URI    Labs:   Imaging: Dg chest 2 view   Consults:  Therapeutics:  Tussionex, duoneb   Discharge Meds: tessalon   Assessment/Plan: 22 year old female presents today with likely viral URI.  Well-appearing in no acute distress.  She has a cough on exam.  Clear lung sounds.  No significant past medical history.  Patient will be given cough medication, work note, strict return precautions.  She verbalized understanding and agreement to today's plan had no further questions or concerns at the time of discharge.      ED Discharge Orders        Ordered    benzonatate (TESSALON) 100 MG capsule  Every 8 hours     05/28/17 1640       Eyvonne Mechanic, PA-C 05/28/17 1641    Azalia Bilis, MD 05/28/17 1730

## 2017-07-15 ENCOUNTER — Emergency Department (HOSPITAL_COMMUNITY)
Admission: EM | Admit: 2017-07-15 | Discharge: 2017-07-15 | Disposition: A | Discharge status: Home / Self Care | Payer: Medicaid Other | Attending: Emergency Medicine | Admitting: Emergency Medicine

## 2017-07-15 ENCOUNTER — Emergency Department (HOSPITAL_COMMUNITY): Payer: Medicaid Other

## 2017-07-15 DIAGNOSIS — M791 Myalgia, unspecified site: Secondary | ICD-10-CM | POA: Insufficient documentation

## 2017-07-15 DIAGNOSIS — R51 Headache: Secondary | ICD-10-CM | POA: Insufficient documentation

## 2017-07-15 DIAGNOSIS — J189 Pneumonia, unspecified organism: Secondary | ICD-10-CM | POA: Insufficient documentation

## 2017-07-15 DIAGNOSIS — R519 Headache, unspecified: Secondary | ICD-10-CM

## 2017-07-15 DIAGNOSIS — Z79899 Other long term (current) drug therapy: Secondary | ICD-10-CM | POA: Insufficient documentation

## 2017-07-15 DIAGNOSIS — R0981 Nasal congestion: Secondary | ICD-10-CM | POA: Insufficient documentation

## 2017-07-15 DIAGNOSIS — R05 Cough: Secondary | ICD-10-CM | POA: Insufficient documentation

## 2017-07-15 LAB — COMPREHENSIVE METABOLIC PANEL
ALT: 13 U/L — AB (ref 14–54)
ANION GAP: 9 (ref 5–15)
AST: 19 U/L (ref 15–41)
Albumin: 4.2 g/dL (ref 3.5–5.0)
Alkaline Phosphatase: 52 U/L (ref 38–126)
BILIRUBIN TOTAL: 0.7 mg/dL (ref 0.3–1.2)
BUN: 7 mg/dL (ref 6–20)
CO2: 22 mmol/L (ref 22–32)
CREATININE: 0.71 mg/dL (ref 0.44–1.00)
Calcium: 9.3 mg/dL (ref 8.9–10.3)
Chloride: 106 mmol/L (ref 101–111)
GFR calc non Af Amer: >60 mL/min (ref >60)
Glucose, Bld: 91 mg/dL (ref 65–99)
Potassium: 3.6 mmol/L (ref 3.5–5.1)
SODIUM: 137 mmol/L (ref 135–145)
TOTAL PROTEIN: 7.1 g/dL (ref 6.5–8.1)

## 2017-07-15 LAB — CBC WITH DIFFERENTIAL/PLATELET
Basophils Absolute: 0 10*3/uL (ref 0.0–0.1)
Basophils Relative: 0 %
EOS ABS: 0.1 10*3/uL (ref 0.0–0.7)
Eosinophils Relative: 1 %
HEMATOCRIT: 40.2 % (ref 36.0–46.0)
HEMOGLOBIN: 13.4 g/dL (ref 12.0–15.0)
LYMPHS ABS: 0.5 10*3/uL — AB (ref 0.7–4.0)
Lymphocytes Relative: 6 %
MCH: 29.1 pg (ref 26.0–34.0)
MCHC: 33.3 g/dL (ref 30.0–36.0)
MCV: 87.2 fL (ref 78.0–100.0)
MONOS PCT: 8 %
Monocytes Absolute: 0.7 10*3/uL (ref 0.1–1.0)
NEUTROS PCT: 85 %
Neutro Abs: 7.3 10*3/uL (ref 1.7–7.7)
Platelets: 286 10*3/uL (ref 150–400)
RBC: 4.61 MIL/uL (ref 3.87–5.11)
RDW: 13.9 % (ref 11.5–15.5)
WBC: 8.6 10*3/uL (ref 4.0–10.5)

## 2017-07-15 LAB — INFLUENZA PANEL BY PCR (TYPE A & B)
INFLBPCR: NEGATIVE
Influenza A By PCR: NEGATIVE

## 2017-07-15 LAB — I-STAT BETA HCG BLOOD, ED (MC, WL, AP ONLY): I-stat hCG, quantitative: <5 m[IU]/mL (ref <5)

## 2017-07-15 MED ORDER — SODIUM CHLORIDE 0.9 % IV BOLUS
1000.0000 mL | Freq: Once | INTRAVENOUS | Status: AC
  Administered 2017-07-15: 1000 mL via INTRAVENOUS

## 2017-07-15 MED ORDER — AZITHROMYCIN 250 MG PO TABS: 500.0000 mg | ORAL_TABLET | Freq: Once | ORAL | Status: DC

## 2017-07-15 MED ORDER — KETOROLAC TROMETHAMINE 30 MG/ML IJ SOLN
15.0000 mg | Freq: Once | INTRAMUSCULAR | Status: AC
  Administered 2017-07-15: 15 mg via INTRAVENOUS
  Filled 2017-07-15: qty 1

## 2017-07-15 MED ORDER — METOCLOPRAMIDE HCL 5 MG/ML IJ SOLN
10.0000 mg | Freq: Once | INTRAMUSCULAR | Status: AC
  Administered 2017-07-15: 10 mg via INTRAVENOUS
  Filled 2017-07-15: qty 2

## 2017-07-15 MED ORDER — DEXAMETHASONE SODIUM PHOSPHATE 4 MG/ML IJ SOLN
4.0000 mg | Freq: Once | INTRAMUSCULAR | Status: AC
  Administered 2017-07-15: 4 mg via INTRAVENOUS
  Filled 2017-07-15: qty 1

## 2017-07-15 MED ORDER — ACETAMINOPHEN 325 MG PO TABS
650.0000 mg | ORAL_TABLET | Freq: Once | ORAL | Status: AC | PRN
  Administered 2017-07-15: 650 mg via ORAL
  Filled 2017-07-15: qty 2

## 2017-07-15 NOTE — ED Provider Notes (Signed)
[facility] EMERGENCY DEPARTMENT Provider Note   CSN: [id number] Arrival date & time: 07/15/17  0805     History   Chief Complaint Chief Complaint  Patient presents with  . Migraine    HPI Sabrina Hahn is a 22 y.o. female with a past medical history of ectopic pregnancy who presents today for evaluation of headache for 1 week, cough, body aches, and nasal congestion.  She reports that her symptoms have been gradually worsening and when she woke up this morning she had chills.  Reports that she has been taking Tylenol and sinus medication without significant relief.  She reports that light hurts her eyes.  She does not have any history of migraines.  She denies any visual changes.  No numbness or tingling.  She reports generally feeling weak and unwell.  She has no known sick contacts recently.  She has a cough that is nonproductive.  Her head hurts like a tight band around the sides of her head, her forehead, and the back of her head and feels like a pressure.    We discussed how some of the medications for migraine can cause harmful effects or death to an unborn baby, patient states that she is not pregnant and wishes to have these before pregnancy test has resulted.  HPI  Past Medical History:  Diagnosis Date  . Medical history non-contributory   . PID (acute pelvic inflammatory disease)     There are no active problems to display for this patient.   Past Surgical History:  Procedure Laterality Date  . NO PAST SURGERIES       OB History    Gravida  1   Para      Term      Preterm      AB  1   Living  0     SAB      TAB      Ectopic  1   Multiple      Live Births               Home Medications    Prior to Admission medications   Medication Sig Start Date End Date Taking? Authorizing Provider  benzonatate (TESSALON) 100 MG capsule Take 1 capsule (100 mg total) by mouth every 8 (eight) hours. 05/28/17   Hedges, Tinnie Gens, PA-C    cephALEXin (KEFLEX) 250 MG capsule Take 1 capsule (250 mg total) by mouth 4 (four) times daily. 03/05/17   Caccavale, Sophia, PA-C  ibuprofen (ADVIL,MOTRIN) 200 MG tablet Take 600 mg by mouth every 6 (six) hours as needed for moderate pain.    [provider]  Multiple Vitamin (MULTIVITAMIN WITH MINERALS) TABS tablet Take 1 tablet by mouth daily.    [provider]    Family History Family History  Problem Relation Age of Onset  . Cancer Other     Social History Social History   Tobacco Use  . Smoking status: Never Smoker  . Smokeless tobacco: Never Used  Substance Use Topics  . Alcohol use: No  . Drug use: No     Allergies   Patient has no known allergies.   Review of Systems Review of Systems  Constitutional: Positive for chills, fatigue and fever.  HENT: Positive for congestion, postnasal drip, rhinorrhea, sinus pressure and sinus pain. Negative for ear discharge, ear pain, sore throat and trouble swallowing.   Eyes: Positive for photophobia. Negative for visual disturbance.  Respiratory: Positive for cough. Negative for  chest tightness and shortness of breath.   Cardiovascular: Negative for chest pain.  Gastrointestinal: Negative for abdominal pain, diarrhea, nausea and vomiting.  Musculoskeletal: Positive for myalgias.  Neurological: Positive for weakness (Subjective generalized weakness) and headaches. Negative for numbness.  All other systems reviewed and are negative.    Physical Exam Updated Vital Signs BP 111/76   Pulse 96   Temp 99.4 F (37.4 C) (Oral)   Resp 18   LMP 07/08/2017   SpO2 100%   Physical Exam  Constitutional: She appears well-developed and well-nourished. No distress.  HENT:  Head: Normocephalic and atraumatic.  Right Ear: Tympanic membrane, external ear and ear canal normal.  Left Ear: Tympanic membrane, external ear and ear canal normal.  Nose: Nose normal. No mucosal edema. Right sinus exhibits no maxillary sinus  tenderness and no frontal sinus tenderness. Left sinus exhibits no maxillary sinus tenderness and no frontal sinus tenderness.  Mouth/Throat: Uvula is midline, oropharynx is clear and moist and mucous membranes are normal. No tonsillar exudate.  Eyes: Conjunctivae are normal. Right eye exhibits no discharge. Left eye exhibits no discharge. No scleral icterus.  Neck: Normal range of motion.  Cardiovascular: Normal rate, regular rhythm, normal heart sounds and intact distal pulses. Exam reveals no friction rub.  No murmur heard. Pulmonary/Chest: Effort normal. No accessory muscle usage or stridor. No tachypnea. No respiratory distress. She has rhonchi in the right lower field.  Abdominal: She exhibits no distension.  Musculoskeletal: She exhibits no edema or deformity.  5/5 strength in bilateral upper and lower extremities.  Patient required coaching to have good effort with testing.   Neurological: She is alert. She exhibits normal muscle tone. GCS eye subscore is 4. GCS verbal subscore is 5. GCS motor subscore is 6.  Mental Status:  Alert, oriented, thought content appropriate, able to give a coherent history. Speech fluent without evidence of aphasia. Able to follow 2 step commands without difficulty.  Cranial Nerves:  II:  Peripheral visual fields grossly normal, pupils equal, round, reactive to light III,IV, VI: ptosis not present, extra-ocular motions intact bilaterally  V,VII: smile symmetric, facial light touch sensation equal VIII: hearing grossly normal to voice  X: uvula elevates symmetrically  XI: bilateral shoulder shrug symmetric and strong XII: midline tongue extension without fassiculations Motor:  Normal tone. 5/5 in upper and lower extremities bilaterally including strong and equal grip strength and dorsiflexion/plantar flexion CV: distal pulses palpable throughout    Skin: Skin is warm and dry. She is not diaphoretic.  Psychiatric: She has a normal mood and affect. Her  behavior is normal.  Nursing note and vitals reviewed.    ED Treatments / Results  Labs (all labs ordered are listed, but only abnormal results are displayed) Labs Reviewed  CBC WITH DIFFERENTIAL/PLATELET - Abnormal; Notable for the following components:      Result Value   Lymphs Abs 0.5 (*)    All other components within normal limits  COMPREHENSIVE METABOLIC PANEL - Abnormal; Notable for the following components:   ALT 13 (*)    All other components within normal limits  INFLUENZA PANEL BY PCR (TYPE A & B)  I-STAT BETA HCG BLOOD, ED ([facility], [facility], [facility] ONLY)    EKG EKG Interpretation  Date/Time:  Sunday July 15 2017 12:39:37 EDT Ventricular Rate:  98 PR Interval:    QRS Duration: 95 QT Interval:  359 QTC Calculation: 459 R Axis:   88 Text Interpretation:  Sinus rhythm Artifact since last tracing no significant change Confirmed  by Mancel Bale [phone]) on 07/15/2017 1:13:18 PM   Radiology Dg Chest 2 View  Result Date: 07/15/2017 CLINICAL DATA:  Flu like symptoms. EXAM: CHEST - 2 VIEW COMPARISON:  May 28, 2017 FINDINGS: The heart, hila, and mediastinum are normal. No pneumothorax. Suggested minimal opacity in the lateral right lung, somewhat nodular configuration. This was not present on the comparison. No other abnormalities in the lungs. IMPRESSION: Mild opacity, somewhat nodular in appearance, in the lateral right lung base. This may represent a subtle infiltrate given history. No other abnormalities. Recommend follow-up to resolution. Electronically Signed   By: Gerome Sam III M.D   On: 07/15/2017 09:07    Procedures Procedures (including critical care time)  Medications Ordered in ED Medications  acetaminophen (TYLENOL) tablet 650 mg (650 mg Oral Given 07/15/17 0821)  sodium chloride 0.9 % bolus 1,000 mL (0 mLs Intravenous Stopped 07/15/17 1310)  ketorolac (TORADOL) 30 MG/ML injection 15 mg (15 mg Intravenous Given 07/15/17 1238)  metoCLOPramide (REGLAN) injection 10  mg (10 mg Intravenous Given 07/15/17 1237)  dexamethasone (DECADRON) injection 4 mg (4 mg Intravenous Given 07/15/17 1236)     Initial Impression / Assessment and Plan / ED Course  I have reviewed the triage vital signs and the nursing notes.  Pertinent labs & imaging results that were available during my care of the patient were reviewed by me and considered in my medical decision making (see chart for details).  Clinical Course as of Jul 15 1648  Sun Jul 15, 2017  1324 Was informed by RN that patient left AMA.  Patient was informed that she does need antibiotics for her pneumonia and aware that this will worsen without the prescription and chose to go anyways according to RN.   [EH]    Clinical Course User Index [EH] Cristina Gong, PA-C   Patient presents today for evaluation of 8-week of worsening nasal congestion, not feeling well, nonproductive cough, sinus pain, and headache.  Headache sounds tension-like.  Pain in the back of her head is easily re-created with palpation over the cervical paraspinal muscles, and along temporalis muscles.  She is neuro intact.  Chest x-ray was obtained showing a right sided pneumonia.  Patient was treated for her headache with Toradol, Reglan, Decadron.  She was given Tylenol prior to my assessment.  Fluid bolus ordered.  Antibiotics ordered for pneumonia.     Patient decided to leave.  She was informed that she had a pneumonia and needed to get antibiotics, however according to nurse was adamant about leaving and left.  I was unable to discuss return precautions, offer patient antibiotic prescription, or other further counseling or treatment as patient left before I was made aware.  Patient refused to sign ama.   Final Clinical Impressions(s) / ED Diagnoses   Final diagnoses:  Bad headache  Community acquired pneumonia of right lung, unspecified part of lung    ED Discharge Orders    None       Norman Clay 07/15/17  1650    Mancel Bale, MD 07/16/17 1419

## 2017-07-15 NOTE — ED Triage Notes (Signed)
Pt to ER for evaluation of migraine x1 week, reports nasal congestion and generalized body aches since last night.reports has been taking tylenol and sinus medication with some relief. Tearful in triage. Sensitivity to light. Pupils equal and reactive, no neuro deficits.

## 2017-07-15 NOTE — ED Notes (Signed)
Pt left. 

## 2017-07-15 NOTE — ED Notes (Signed)
Pt called out (twice) states "take this IV out it is agitating me".  Fluids stopped, IV removed per pt request

## 2017-07-15 NOTE — ED Notes (Signed)
Pt noted to have non productive cough in triage.

## 2017-07-15 NOTE — ED Notes (Signed)
Pt refusing to stay to talk with EDP at this time. Pt informed she needed to get her antibiotic. Pt adamant about leaving.

## 2017-07-17 ENCOUNTER — Encounter: Payer: Self-pay | Admitting: Emergency Medicine

## 2017-07-17 ENCOUNTER — Ambulatory Visit (INDEPENDENT_AMBULATORY_CARE_PROVIDER_SITE_OTHER): Payer: Self-pay | Admitting: Emergency Medicine

## 2017-07-17 VITALS — BP 110/70 | HR 82 | Temp 98.0°F | Resp 16 | Ht 61.0 in | Wt 109.8 lb

## 2017-07-17 DIAGNOSIS — R05 Cough: Secondary | ICD-10-CM

## 2017-07-17 DIAGNOSIS — R059 Cough, unspecified: Secondary | ICD-10-CM

## 2017-07-17 DIAGNOSIS — J22 Unspecified acute lower respiratory infection: Secondary | ICD-10-CM

## 2017-07-17 MED ORDER — AMOXICILLIN 500 MG PO CAPS: 500.0000 mg | ORAL_CAPSULE | Freq: Three times a day (TID) | ORAL | 0 refills | Status: AC

## 2017-07-17 NOTE — Patient Instructions (Addendum)
     IF you received an x-ray today, you will receive an invoice from [facility]. Please contact [facility] at [phone] with questions or concerns regarding your invoice.   IF you received labwork today, you will receive an invoice from [facility]. Please contact [facility] at [phone] with questions or concerns regarding your invoice.   Our billing staff will not be able to assist you with questions regarding bills from these companies.  You will be contacted with the lab results as soon as they are available. The fastest way to get your results is to activate your My Chart account. Instructions are located on the last page of this paperwork. If you have not heard from us regarding the results in 2 weeks, please contact this office.     Acute Bronchitis, Adult Acute bronchitis is when air tubes (bronchi) in the lungs suddenly get swollen. The condition can make it hard to breathe. It can also cause these symptoms:  A cough.  Coughing up clear, yellow, or green mucus.  Wheezing.  Chest congestion.  Shortness of breath.  A fever.  Body aches.  Chills.  A sore throat.  Follow these instructions at home: Medicines  Take over-the-counter and prescription medicines only as told by your doctor.  If you were prescribed an antibiotic medicine, take it as told by your doctor. Do not stop taking the antibiotic even if you start to feel better. General instructions  Rest.  Drink enough fluids to keep your pee (urine) clear or pale yellow.  Avoid smoking and secondhand smoke. If you smoke and you need help quitting, ask your doctor. Quitting will help your lungs heal faster.  Use an inhaler, cool mist vaporizer, or humidifier as told by your doctor.  Keep all follow-up visits as told by your doctor. This is important. How is this prevented? To lower your risk of getting this condition again:  Wash your hands often with soap and water. If you cannot  use soap and water, use hand sanitizer.  Avoid contact with people who have cold symptoms.  Try not to touch your hands to your mouth, nose, or eyes.  Make sure to get the flu shot every year.  Contact a doctor if:  Your symptoms do not get better in 2 weeks. Get help right away if:  You cough up blood.  You have chest pain.  You have very bad shortness of breath.  You become dehydrated.  You faint (pass out) or keep feeling like you are going to pass out.  You keep throwing up (vomiting).  You have a very bad headache.  Your fever or chills gets worse. This information is not intended to replace advice given to you by your health care provider. Make sure you discuss any questions you have with your health care provider. Document Released: 08/23/2007 Document Revised: 10/13/2015 Document Reviewed: 08/25/2015 Elsevier Interactive Patient Education  2018 Elsevier Inc.  

## 2017-07-17 NOTE — Progress Notes (Signed)
Sabrina Hahn 22 y.o.   Chief Complaint  Patient presents with  . Pneumonia    went to ED x2 days ago and told she had pneumonia; have not picked up her meds yet    HISTORY OF PRESENT ILLNESS: This is a 22 y.o. female has been sick for 1 week.  Complaining of respiratory symptoms.  Persistent cough with chest congestion.  Went to the ED 2 days ago and told she had pneumonia.  Chest x-ray reviewed with the patient.  She left AMA so no antibiotics were sent to her pharmacy.  HPI   Prior to Admission medications   Medication Sig Start Date End Date Taking? Authorizing Provider  benzonatate (TESSALON) 100 MG capsule Take 1 capsule (100 mg total) by mouth every 8 (eight) hours. Patient not taking: Reported on 07/17/2017 05/28/17   Hahn, Sabrina Gens, PA-C  cephALEXin (KEFLEX) 250 MG capsule Take 1 capsule (250 mg total) by mouth 4 (four) times daily. Patient not taking: Reported on 07/17/2017 03/05/17   Hahn, Sophia, PA-C  ibuprofen (ADVIL,MOTRIN) 200 MG tablet Take 600 mg by mouth every 6 (six) hours as needed for moderate pain.    [provider]  Multiple Vitamin (MULTIVITAMIN WITH MINERALS) TABS tablet Take 1 tablet by mouth daily.    [provider]    No Known Allergies  There are no active problems to display for this patient.   Past Medical History:  Diagnosis Date  . Medical history non-contributory   . PID (acute pelvic inflammatory disease)     Past Surgical History:  Procedure Laterality Date  . NO PAST SURGERIES      Social History   Socioeconomic History  . Marital status: Single    Spouse name: Not on file  . Number of children: Not on file  . Years of education: Not on file  . Highest education level: Not on file  Occupational History  . Not on file  Social Needs  . Financial resource strain: Not on file  . Food insecurity:    Worry: Not on file    Inability: Not on file  . Transportation needs:    Medical: Not on file   Non-medical: Not on file  Tobacco Use  . Smoking status: Never Smoker  . Smokeless tobacco: Never Used  Substance and Sexual Activity  . Alcohol use: No  . Drug use: No  . Sexual activity: Yes    Birth control/protection: Condom  Lifestyle  . Physical activity:    Days per week: Not on file    Minutes per session: Not on file  . Stress: Not on file  Relationships  . Social connections:    Talks on phone: Not on file    Gets together: Not on file    Attends religious service: Not on file    Active member of club or organization: Not on file    Attends meetings of clubs or organizations: Not on file    Relationship status: Not on file  . Intimate partner violence:    Fear of current or ex partner: Not on file    Emotionally abused: Not on file    Physically abused: Not on file    Forced sexual activity: Not on file  Other Topics Concern  . Not on file  Social History Narrative  . Not on file    Family History  Problem Relation Age of Onset  . Cancer Other      Review of Systems  Constitutional:  Negative.  Negative for chills and fever.  HENT: Positive for congestion. Negative for sore throat.   Respiratory: Positive for cough and sputum production. Negative for shortness of breath and wheezing.   Cardiovascular: Negative.  Negative for chest pain.  Gastrointestinal: Negative.  Negative for abdominal pain, nausea and vomiting.  Genitourinary: Negative.  Negative for dysuria and hematuria.  Musculoskeletal: Negative.  Negative for back pain, myalgias and neck pain.  Skin: Negative.  Negative for rash.  Neurological: Negative.  Negative for dizziness and headaches.    Vitals:   07/17/17 1228  BP: 110/70  Pulse: 82  Resp: 16  Temp: 98 F (36.7 C)  SpO2: 100%    Physical Exam  Constitutional: She is oriented to person, place, and time. She appears well-developed and well-nourished.  HENT:  Head: Normocephalic and atraumatic.  Nose: Nose normal.    Mouth/Throat: Oropharynx is clear and moist.  Eyes: Pupils are equal, round, and reactive to light. Conjunctivae and EOM are normal.  Neck: Normal range of motion. Neck supple.  Cardiovascular: Normal rate, regular rhythm and normal heart sounds.  Pulmonary/Chest: Effort normal and breath sounds normal.  Musculoskeletal: Normal range of motion.  Neurological: She is alert and oriented to person, place, and time. No sensory deficit. She exhibits normal muscle tone.  Skin: Skin is warm and dry. Capillary refill takes less than 2 seconds.  Psychiatric: She has a normal mood and affect. Her behavior is normal.  Vitals reviewed.  Pertinent labs & imaging results that were available during my care of the patient were reviewed by me and considered in my medical decision making (see chart for details).   ASSESSMENT & PLAN: [name] was seen today for pneumonia.  Diagnoses and all orders for this visit:  Cough  Lower resp. tract infection -     amoxicillin (AMOXIL) 500 MG capsule; Take 1 capsule (500 mg total) by mouth 3 (three) times daily for 7 days.     Patient Instructions       IF you received an x-ray today, you will receive an invoice from [facility]. Please contact [facility] at [phone] with questions or concerns regarding your invoice.   IF you received labwork today, you will receive an invoice from [facility]. Please contact [facility] at [phone] with questions or concerns regarding your invoice.   Our billing staff will not be able to assist you with questions regarding bills from these companies.  You will be contacted with the lab results as soon as they are available. The fastest way to get your results is to activate your My Chart account. Instructions are located on the last page of this paperwork. If you have not heard from [place] regarding the results in 2 weeks, please contact this office.      Acute Bronchitis, Adult Acute bronchitis is  when air tubes (bronchi) in the lungs suddenly get swollen. The condition can make it hard to breathe. It can also cause these symptoms:  A cough.  Coughing up clear, yellow, or green mucus.  Wheezing.  Chest congestion.  Shortness of breath.  A fever.  Body aches.  Chills.  A sore throat.  Follow these instructions at home: Medicines  Take over-the-counter and prescription medicines only as told by your doctor.  If you were prescribed an antibiotic medicine, take it as told by your doctor. Do not stop taking the antibiotic even if you start to feel better. General instructions  Rest.  Drink enough fluids to keep your pee (  urine) clear or pale yellow.  Avoid smoking and secondhand smoke. If you smoke and you need help quitting, ask your doctor. Quitting will help your lungs heal faster.  Use an inhaler, cool mist vaporizer, or humidifier as told by your doctor.  Keep all follow-up visits as told by your doctor. This is important. How is this prevented? To lower your risk of getting this condition again:  Wash your hands often with soap and water. If you cannot use soap and water, use hand sanitizer.  Avoid contact with people who have cold symptoms.  Try not to touch your hands to your mouth, nose, or eyes.  Make sure to get the flu shot every year.  Contact a doctor if:  Your symptoms do not get better in 2 weeks. Get help right away if:  You cough up blood.  You have chest pain.  You have very bad shortness of breath.  You become dehydrated.  You faint (pass out) or keep feeling like you are going to pass out.  You keep throwing up (vomiting).  You have a very bad headache.  Your fever or chills gets worse. This information is not intended to replace advice given to you by your health care provider. Make sure you discuss any questions you have with your health care provider. Document Released: 08/23/2007 Document Revised: 10/13/2015 Document  Reviewed: 08/25/2015 Elsevier Interactive Patient Education  2018 Elsevier Inc.     Edwina Barth, MD [facility]

## 2018-02-20 ENCOUNTER — Telehealth: Payer: Self-pay | Admitting: Emergency Medicine

## 2018-02-20 NOTE — Telephone Encounter (Signed)
LVM for pt in regards to their appt on 02/27/18 with Dr. Sagardia. Due to Dr. Sagardia’s schedule (staff retreat - office closing early) change, he will be unavailable that day at that time. When pt calls back, please reschedule with Sagardia at their convenience. Thank you! °

## 2018-02-27 ENCOUNTER — Encounter: Payer: Medicaid Other | Admitting: Emergency Medicine

## 2018-05-22 ENCOUNTER — Encounter: Payer: Medicaid Other | Admitting: Family Medicine

## 2018-10-01 ENCOUNTER — Encounter: Payer: Self-pay | Admitting: Family Medicine

## 2018-10-01 ENCOUNTER — Other Ambulatory Visit: Payer: Self-pay

## 2018-10-01 ENCOUNTER — Ambulatory Visit (INDEPENDENT_AMBULATORY_CARE_PROVIDER_SITE_OTHER): Payer: Commercial Managed Care - PPO | Admitting: Family Medicine

## 2018-10-01 VITALS — BP 115/77 | HR 98 | Temp 98.6°F | Ht 60.0 in | Wt 118.0 lb

## 2018-10-01 DIAGNOSIS — H9191 Unspecified hearing loss, right ear: Secondary | ICD-10-CM | POA: Diagnosis not present

## 2018-10-01 DIAGNOSIS — H6501 Acute serous otitis media, right ear: Secondary | ICD-10-CM

## 2018-10-01 MED ORDER — AMOXICILLIN 875 MG PO TABS: 875.0000 mg | ORAL_TABLET | Freq: Two times a day (BID) | ORAL | 0 refills | Status: DC

## 2018-10-01 NOTE — Patient Instructions (Addendum)
     If you have lab work done today you will be contacted with your lab results within the next 2 weeks.  If you have not heard from [place] then please contact us. The fastest way to get your results is to register for My Chart. Pick up sudafed to take one in the morning daily Pick up flonase to use 2 sprays to each nostril every night-brush teeth Amoxil-take with food twice day If hearing has not improved in 2 weeks, call for referral to ENT specialist  IF you received an x-ray today, you will receive an invoice from [facility]. Please contact [facility] at [phone] with questions or concerns regarding your invoice.   IF you received labwork today, you will receive an invoice from [facility]. Please contact [facility] at [phone] with questions or concerns regarding your invoice.   Our billing staff will not be able to assist you with questions regarding bills from these companies.  You will be contacted with the lab results as soon as they are available. The fastest way to get your results is to activate your My Chart account. Instructions are located on the last page of this paperwork. If you have not heard from [place] regarding the results in 2 weeks, please contact this office.

## 2018-10-01 NOTE — Progress Notes (Signed)
Acute Office Visit  Subjective:    Patient ID: Sabrina Hahn, female    DOB: 03/13/1996, 23 y.o.   MRN: [id number]  Chief Complaint  Patient presents with  . ear cloged    2 weeks  . Headache    2 weeks    HPI Patient is in today for hearing loss and headaches.  Pt with no fever.  Pt states right sided loss over the past 2 weeks.  Normal hearing on the left.    Past Medical History:  Diagnosis Date  . Medical history non-contributory   . PID (acute pelvic inflammatory disease)     Past Surgical History:  Procedure Laterality Date  . NO PAST SURGERIES      Family History  Problem Relation Age of Onset  . Cancer Other     Social History   Socioeconomic History  . Marital status: Single    Spouse name: Not on file  . Number of children: Not on file  . Years of education: Not on file  . Highest education level: Not on file  Occupational History  . Not on file  Social Needs  . Financial resource strain: Not on file  . Food insecurity    Worry: Not on file    Inability: Not on file  . Transportation needs    Medical: Not on file    Non-medical: Not on file  Tobacco Use  . Smoking status: Never Smoker  . Smokeless tobacco: Never Used  Substance and Sexual Activity  . Alcohol use: No  . Drug use: No  . Sexual activity: Yes    Birth control/protection: Condom  Lifestyle  . Physical activity    Days per week: Not on file    Minutes per session: Not on file  . Stress: Not on file  Relationships  . Social Musicianconnections    Talks on phone: Not on file    Gets together: Not on file    Attends religious service: Not on file    Active member of club or organization: Not on file    Attends meetings of clubs or organizations: Not on file    Relationship status: Not on file  . Intimate partner violence    Fear of current or ex partner: Not on file    Emotionally abused: Not on file    Physically abused: Not on file    Forced sexual activity: Not on file   Other Topics Concern  . Not on file  Social History Narrative  . Not on file    Outpatient Medications Prior to Visit  Medication Sig Dispense Refill  . Multiple Vitamin (MULTIVITAMIN WITH MINERALS) TABS tablet Take 1 tablet by mouth daily.    . benzonatate (TESSALON) 100 MG capsule Take 1 capsule (100 mg total) by mouth every 8 (eight) hours. (Patient not taking: Reported on 07/17/2017) 21 capsule 0  . cephALEXin (KEFLEX) 250 MG capsule Take 1 capsule (250 mg total) by mouth 4 (four) times daily. (Patient not taking: Reported on 07/17/2017) 28 capsule 0  . ibuprofen (ADVIL,MOTRIN) 200 MG tablet Take 600 mg by mouth every 6 (six) hours as needed for moderate pain.     No facility-administered medications prior to visit.     No Known Allergies  Review of Systems  Constitutional: Negative for chills, fever and malaise/fatigue.  HENT: Positive for hearing loss. Negative for congestion, ear discharge, sinus pain, sore throat and tinnitus.   Respiratory: Negative for cough, sputum production and  stridor.   Cardiovascular: Negative for chest pain.  Neurological: Positive for dizziness, focal weakness and headaches. Negative for weakness.       Objective:    Physical Exam  Constitutional: She appears well-developed and well-nourished. No distress.  HENT:  Head: Normocephalic and atraumatic.  Right Ear: External ear normal.  Left Ear: External ear normal.  Nose: Nose normal.  Mouth/Throat: Oropharynx is clear and moist. No oropharyngeal exudate.  Eyes: Conjunctivae are normal.  Neck: Normal range of motion.  Cardiovascular: Normal rate and regular rhythm.  Pulmonary/Chest: Effort normal.  Lymphadenopathy:    She has no cervical adenopathy.   TM +fluid -right, no eryth, normal LR on the left BP 115/77 (BP Location: Right Arm, Patient Position: Sitting, Cuff Size: Normal)   Pulse 98   Temp 98.6 F (37 C) (Oral)   Ht 5' (1.524 m)   Wt 118 lb (53.5 kg)   LMP 09/24/2018   SpO2  99%   BMI 23.05 kg/m  Wt Readings from Last 3 Encounters:  10/01/18 118 lb (53.5 kg)  07/17/17 109 lb 12.8 oz (49.8 kg)  02/13/17 112 lb 2 oz (50.9 kg)    Health Maintenance Due  Topic Date Due  . TETANUS/TDAP  06/06/2013  . PAP-Cervical Cytology Screening  06/07/2015  . PAP SMEAR-Modifier  06/07/2015    There are no preventive care reminders to display for this patient.   No results found for: TSH Lab Results  Component Value Date   WBC 8.6 07/15/2017   HGB 13.4 07/15/2017   HCT 40.2 07/15/2017   MCV 87.2 07/15/2017   PLT 286 07/15/2017   Lab Results  Component Value Date   NA 137 07/15/2017   K 3.6 07/15/2017   CO2 22 07/15/2017   GLUCOSE 91 07/15/2017   BUN 7 07/15/2017   CREATININE 0.71 07/15/2017   BILITOT 0.7 07/15/2017   ALKPHOS 52 07/15/2017   AST 19 07/15/2017   ALT 13 (L) 07/15/2017   PROT 7.1 07/15/2017   ALBUMIN 4.2 07/15/2017   CALCIUM 9.3 07/15/2017   ANIONGAP 9 07/15/2017   1. Right acute serous otitis media, recurrence not specified Amoxil-rx Sudafed flonase If no improvement, ENT for evaluation-pt acknowledged understanding -referral completed as already 2 week history.    Assessment & Plan:  Lenea Bywater Hannah Beat, MD

## 2018-11-05 ENCOUNTER — Emergency Department (HOSPITAL_COMMUNITY): Payer: Medicaid Other

## 2018-11-05 ENCOUNTER — Emergency Department (HOSPITAL_COMMUNITY)
Admission: EM | Admit: 2018-11-05 | Discharge: 2018-11-05 | Disposition: A | Discharge status: Home / Self Care | Payer: Medicaid Other | Attending: Emergency Medicine | Admitting: Emergency Medicine

## 2018-11-05 ENCOUNTER — Encounter (HOSPITAL_COMMUNITY): Payer: Self-pay | Admitting: Emergency Medicine

## 2018-11-05 ENCOUNTER — Other Ambulatory Visit: Payer: Self-pay

## 2018-11-05 DIAGNOSIS — R112 Nausea with vomiting, unspecified: Secondary | ICD-10-CM | POA: Insufficient documentation

## 2018-11-05 DIAGNOSIS — N132 Hydronephrosis with renal and ureteral calculous obstruction: Secondary | ICD-10-CM | POA: Insufficient documentation

## 2018-11-05 HISTORY — DX: Unspecified ectopic pregnancy without intrauterine pregnancy: O00.90

## 2018-11-05 LAB — COMPREHENSIVE METABOLIC PANEL
ALT: 20 U/L (ref 0–44)
AST: 22 U/L (ref 15–41)
Albumin: 4.1 g/dL (ref 3.5–5.0)
Alkaline Phosphatase: 53 U/L (ref 38–126)
Anion gap: 12 (ref 5–15)
BUN: 9 mg/dL (ref 6–20)
CO2: 19 mmol/L — ABNORMAL LOW (ref 22–32)
Calcium: 9.3 mg/dL (ref 8.9–10.3)
Chloride: 108 mmol/L (ref 98–111)
Creatinine, Ser: 0.9 mg/dL (ref 0.44–1.00)
GFR calc Af Amer: >60 mL/min (ref >60)
GFR calc non Af Amer: >60 mL/min (ref >60)
Glucose, Bld: 128 mg/dL — ABNORMAL HIGH (ref 70–99)
Potassium: 3.5 mmol/L (ref 3.5–5.1)
Sodium: 139 mmol/L (ref 135–145)
Total Bilirubin: 0.6 mg/dL (ref 0.3–1.2)
Total Protein: 7.3 g/dL (ref 6.5–8.1)

## 2018-11-05 LAB — URINALYSIS, ROUTINE W REFLEX MICROSCOPIC
Bacteria, UA: NONE SEEN
Bilirubin Urine: NEGATIVE
Glucose, UA: NEGATIVE mg/dL
Hgb urine dipstick: NEGATIVE
Ketones, ur: 5 mg/dL — AB
Leukocytes,Ua: NEGATIVE
Nitrite: NEGATIVE
Protein, ur: 30 mg/dL — AB
Specific Gravity, Urine: 1.026 (ref 1.005–1.030)
pH: 6 (ref 5.0–8.0)

## 2018-11-05 LAB — CBC
HCT: 43 % (ref 36.0–46.0)
Hemoglobin: 13.8 g/dL (ref 12.0–15.0)
MCH: 29.5 pg (ref 26.0–34.0)
MCHC: 32.1 g/dL (ref 30.0–36.0)
MCV: 91.9 fL (ref 80.0–100.0)
Platelets: 319 10*3/uL (ref 150–400)
RBC: 4.68 MIL/uL (ref 3.87–5.11)
RDW: 13.9 % (ref 11.5–15.5)
WBC: 21.3 10*3/uL — ABNORMAL HIGH (ref 4.0–10.5)
nRBC: 0 % (ref 0.0–0.2)

## 2018-11-05 LAB — I-STAT BETA HCG BLOOD, ED (MC, WL, AP ONLY): I-stat hCG, quantitative: <5 m[IU]/mL (ref <5)

## 2018-11-05 LAB — LIPASE, BLOOD: Lipase: 36 U/L (ref 11–51)

## 2018-11-05 MED ORDER — OXYCODONE-ACETAMINOPHEN 5-325 MG PO TABS
1.0000 | ORAL_TABLET | Freq: Once | ORAL | Status: DC
  Filled 2018-11-05: qty 1

## 2018-11-05 MED ORDER — SODIUM CHLORIDE 0.9% FLUSH: 3.0000 mL | Freq: Once | INTRAVENOUS | Status: DC

## 2018-11-05 MED ORDER — ONDANSETRON HCL 4 MG/2ML IJ SOLN
4.0000 mg | Freq: Once | INTRAMUSCULAR | Status: AC
  Administered 2018-11-05: 4 mg via INTRAVENOUS
  Filled 2018-11-05: qty 2

## 2018-11-05 MED ORDER — KETOROLAC TROMETHAMINE 15 MG/ML IJ SOLN
15.0000 mg | Freq: Once | INTRAMUSCULAR | Status: AC
  Administered 2018-11-05: 15 mg via INTRAVENOUS
  Filled 2018-11-05: qty 1

## 2018-11-05 MED ORDER — HYDROCODONE-ACETAMINOPHEN 5-325 MG PO TABS: 1.0000 | ORAL_TABLET | Freq: Three times a day (TID) | ORAL | 0 refills | Status: AC | PRN

## 2018-11-05 MED ORDER — SODIUM CHLORIDE 0.9 % IV BOLUS
1000.0000 mL | Freq: Once | INTRAVENOUS | Status: AC
  Administered 2018-11-05: 1000 mL via INTRAVENOUS

## 2018-11-05 MED ORDER — TAMSULOSIN HCL 0.4 MG PO CAPS: 0.4000 mg | ORAL_CAPSULE | Freq: Every day | ORAL | 0 refills | Status: AC

## 2018-11-05 MED ORDER — TAMSULOSIN HCL 0.4 MG PO CAPS
0.4000 mg | ORAL_CAPSULE | Freq: Once | ORAL | Status: AC
  Administered 2018-11-05: 06:00:00 0.4 mg via ORAL
  Filled 2018-11-05: qty 1

## 2018-11-05 MED ORDER — HYDROCODONE-ACETAMINOPHEN 5-325 MG PO TABS
1.0000 | ORAL_TABLET | Freq: Once | ORAL | Status: AC
  Administered 2018-11-05: 1 via ORAL
  Filled 2018-11-05: qty 1

## 2018-11-05 MED ORDER — ONDANSETRON 4 MG PO TBDP
4.0000 mg | ORAL_TABLET | Freq: Once | ORAL | Status: AC
  Administered 2018-11-05: 4 mg via ORAL
  Filled 2018-11-05: qty 1

## 2018-11-05 MED ORDER — IBUPROFEN 600 MG PO TABS: 600.0000 mg | ORAL_TABLET | Freq: Four times a day (QID) | ORAL | 0 refills | Status: DC | PRN

## 2018-11-05 MED ORDER — MORPHINE SULFATE (PF) 4 MG/ML IV SOLN
4.0000 mg | Freq: Once | INTRAVENOUS | Status: AC
  Administered 2018-11-05: 4 mg via INTRAVENOUS
  Filled 2018-11-05: qty 1

## 2018-11-05 MED ORDER — ONDANSETRON 4 MG PO TBDP: 4.0000 mg | ORAL_TABLET | Freq: Three times a day (TID) | ORAL | 0 refills | Status: AC | PRN

## 2018-11-05 NOTE — ED Triage Notes (Signed)
Pt c/o RUQ to RLQ severe pain onset today, with radiation to flank. +n/v, denies diarrhea.  Pt has not taken OTC meds for pain.  Pt denies urinary s/s.

## 2018-11-05 NOTE — ED Notes (Signed)
Pt immediately vomited after swallowing percocet. Pt tearful, pt unable to sit still, rolling around in chair, heaving.

## 2018-11-05 NOTE — ED Provider Notes (Signed)
[facility]  CSN: [id number] Arrival date & time: 11/05/18 [id number]  Chief Complaint(s) Abdominal Pain  HPI Sabrina Hahn is a 23 y.o. female   The history is provided by the patient.  Abdominal Pain Pain location:  R flank Pain quality: stabbing   Pain radiates to:  R flank, back and groin Pain severity:  Severe Onset quality:  Sudden Timing:  Constant Progression:  Waxing and waning Chronicity:  New Relieved by:  Nothing Worsened by:  Movement, palpation and position changes Associated symptoms: nausea and vomiting   Associated symptoms: no chest pain, no chills, no constipation, no fever and no shortness of breath     Past Medical History Past Medical History:  Diagnosis Date  . Ectopic pregnancy   . Medical history non-contributory   . PID (acute pelvic inflammatory disease)    There are no active problems to display for this patient.  Home Medication(s) Prior to Admission medications   Medication Sig Start Date End Date Taking? Authorizing Provider  fluticasone (FLONASE) 50 MCG/ACT nasal spray Place 2 sprays into both nostrils daily. 10/17/18  Yes [provider]  vitamin C (ASCORBIC ACID) 500 MG tablet Take 500 mg by mouth daily.   Yes [provider]  amoxicillin (AMOXIL) 875 MG tablet Take 1 tablet (875 mg total) by mouth 2 (two) times daily. Patient not taking: Reported on 11/05/2018 10/01/18   Wandra Feinsteinorum, Lisa L, MD  HYDROcodone-acetaminophen (NORCO/VICODIN) 5-325 MG tablet Take 1 tablet by mouth every 8 (eight) hours as needed for up to 3 days for severe pain (That is not improved by your scheduled motrin regimen). Please do not exceed 4000 mg of acetaminophen (Tylenol) a 24-hour period. Please note that he may be prescribed additional medicine that contains acetaminophen. 11/05/18 11/08/18  Nira Connardama, Ramia Sidney Eduardo, MD  ibuprofen (ADVIL) 600 MG tablet Take 1 tablet (600 mg total) by mouth every 6 (six)  hours as needed. 11/05/18   Nira Connardama, Wellington Winegarden Eduardo, MD  ondansetron (ZOFRAN ODT) 4 MG disintegrating tablet Take 1 tablet (4 mg total) by mouth every 8 (eight) hours as needed for up to 3 days for nausea or vomiting. 11/05/18 11/08/18  Lucrezia Dehne, Amadeo GarnetPedro Eduardo, MD  tamsulosin (FLOMAX) 0.4 MG CAPS capsule Take 1 capsule (0.4 mg total) by mouth daily for 10 days. 11/05/18 11/15/18  Nira Connardama, Kyson Kupper Eduardo, MD                                                                                                                                    Past Surgical History Past Surgical History:  Procedure Laterality Date  . NO PAST SURGERIES     Family History Family History  Problem Relation Age of Onset  . Cancer Other     Social History Social History   Tobacco Use  . Smoking status: Never Smoker  . Smokeless tobacco: Never Used  Substance Use Topics  .  Alcohol use: No  . Drug use: No   Allergies Patient has no known allergies.  Review of Systems Review of Systems  Constitutional: Negative for chills and fever.  Respiratory: Negative for shortness of breath.   Cardiovascular: Negative for chest pain.  Gastrointestinal: Positive for abdominal pain, nausea and vomiting. Negative for constipation.   All other systems are reviewed and are negative for acute change except as noted in the HPI  Physical Exam Vital Signs  I have reviewed the triage vital signs BP (!) 150/90 (BP Location: Right Arm)   Pulse (!) 102   Temp 98.3 F (36.8 C) (Oral)   Resp 18   Ht 5' (1.524 m)   Wt 53 kg   LMP 10/28/2018   SpO2 100%   BMI 22.82 kg/m   Physical Exam Vitals signs reviewed.  Constitutional:      General: She is not in acute distress.    Appearance: She is well-developed. She is not diaphoretic.     Comments: In obvious discomfort  HENT:     Head: Normocephalic and atraumatic.     Right Ear: External ear normal.     Left Ear: External ear normal.     Nose: Nose normal.  Eyes:     General:  No scleral icterus.    Conjunctiva/sclera: Conjunctivae normal.  Neck:     Musculoskeletal: Normal range of motion.     Trachea: Phonation normal.  Cardiovascular:     Rate and Rhythm: Normal rate and regular rhythm.  Pulmonary:     Effort: Pulmonary effort is normal. No respiratory distress.     Breath sounds: No stridor.  Abdominal:     General: There is no distension.     Tenderness: There is abdominal tenderness in the right upper quadrant. There is no guarding or rebound. Negative signs include Murphy's sign and McBurney's sign.  Musculoskeletal: Normal range of motion.  Neurological:     Mental Status: She is alert and oriented to person, place, and time.  Psychiatric:        Behavior: Behavior normal.     ED Results and Treatments Labs (all labs ordered are listed, but only abnormal results are displayed) Labs Reviewed  COMPREHENSIVE METABOLIC PANEL - Abnormal; Notable for the following components:      Result Value   CO2 19 (*)    Glucose, Bld 128 (*)    All other components within normal limits  CBC - Abnormal; Notable for the following components:   WBC 21.3 (*)    All other components within normal limits  URINALYSIS, ROUTINE W REFLEX MICROSCOPIC - Abnormal; Notable for the following components:   APPearance CLOUDY (*)    Ketones, ur 5 (*)    Protein, ur 30 (*)    All other components within normal limits  LIPASE, BLOOD  I-STAT BETA HCG BLOOD, ED ([facility], [facility], [facility] ONLY)  EKG  EKG Interpretation  Date/Time:    Ventricular Rate:    PR Interval:    QRS Duration:   QT Interval:    QTC Calculation:   R Axis:     Text Interpretation:        Radiology Ct Renal Stone Study  Result Date: 11/05/2018 CLINICAL DATA:  Flank pain with stone disease suspected EXAM: CT ABDOMEN AND PELVIS WITHOUT CONTRAST TECHNIQUE: Multidetector CT imaging of the  abdomen and pelvis was performed following the standard protocol without IV contrast. COMPARISON:  None. FINDINGS: Lower chest:  No contributory findings. Hepatobiliary: No focal liver abnormality.No evidence of biliary obstruction or stone. Pancreas: Unremarkable. Spleen: Unremarkable. Adrenals/Urinary Tract: Negative adrenals. Right hydronephrosis, hydroureter, and renal low-density expansion from a 3 mm UVJ calculus. An additional right pelvic calcification is most likely vascular. Multiple phleboliths are present on the left. Punctate right upper pole calculus. Unremarkable bladder. Stomach/Bowel:  No obstruction or bowel inflammation. Vascular/Lymphatic: No acute vascular abnormality. No mass or adenopathy. Reproductive:No pathologic findings. Other: No ascites or pneumoperitoneum. Musculoskeletal: No acute abnormalities. IMPRESSION: 1. Obstructing 3 mm right UVJ calculus. 2. Punctate right upper pole calculus. Electronically Signed   By: Monte Fantasia M.D.   On: 11/05/2018 04:59    Pertinent labs & imaging results that were available during my care of the patient were reviewed by me and considered in my medical decision making (see chart for details).  Medications Ordered in ED Medications  sodium chloride flush (NS) 0.9 % injection 3 mL (has no administration in time range)  oxyCODONE-acetaminophen (PERCOCET/ROXICET) 5-325 MG per tablet 1 tablet (1 tablet Oral Not Given 11/05/18 0331)  ondansetron (ZOFRAN-ODT) disintegrating tablet 4 mg (4 mg Oral Given 11/05/18 0237)  ondansetron (ZOFRAN) injection 4 mg (4 mg Intravenous Given 11/05/18 0315)  sodium chloride 0.9 % bolus 1,000 mL (1,000 mLs Intravenous New Bag/Given 11/05/18 0320)  ketorolac (TORADOL) 15 MG/ML injection 15 mg (15 mg Intravenous Given 11/05/18 0318)  ketorolac (TORADOL) 15 MG/ML injection 15 mg (15 mg Intravenous Given 11/05/18 0536)  ondansetron (ZOFRAN) injection 4 mg (4 mg Intravenous Given 11/05/18 0534)  tamsulosin (FLOMAX)  capsule 0.4 mg (0.4 mg Oral Given 11/05/18 0540)  morphine 4 MG/ML injection 4 mg (4 mg Intravenous Given 11/05/18 0637)  HYDROcodone-acetaminophen (NORCO/VICODIN) 5-325 MG per tablet 1 tablet (1 tablet Oral Given 11/05/18 4081)                                                                                                                                    Procedures Ultrasound ED Abd  Date/Time: 11/05/2018 3:58 AM Performed by: Fatima Blank, MD Authorized by: Fatima Blank, MD   Procedure details:    Indications: abdominal pain and flank pain     Assessment for:  Gallstones and hydronephrosis   Left renal:  Visualized   Right renal:  Visualized   Hepatobiliary:  Visualized   Images: archived  Left renal findings:    Nephrolithiasis: not identified     Renal stones: not identified     Perinephric fluid: not identified     Hydronephrosis: none   Right renal findings:    Mass: not identified     Nephrolithiasis: not identified     Renal stones: not identified     Perinephric fluid: not identified     Hydronephrosis: mild   Hepatobiliary findings:    Common bile duct:  Normal   Gallbladder wall:  Normal   Gallbladder stones: not identified     Sonographic Murphy's sign: negative    Ultrasound ED Peripheral IV (Provider)  Date/Time: 11/05/2018 4:00 AM Performed by: Nira Connardama, Ski Polich Eduardo, MD Authorized by: Nira Connardama, Marrie Chandra Eduardo, MD   Procedure details:    Indications: multiple failed IV attempts     Location:  Right AC   Angiocath:  20 G   Bedside Ultrasound Guided: Yes     Images: archived     Patient tolerated procedure without complications: Yes     Dressing applied: Yes      (including critical care time)  Medical Decision Making / ED Course I have reviewed the nursing notes for this encounter and the patient's prior records (if available in EHR or on provided paperwork).   Sabrina Hahn was evaluated in Emergency Department on 11/05/2018  for the symptoms described in the history of present illness. She was evaluated in the context of the global COVID-19 pandemic, which necessitated consideration that the patient might be at risk for infection with the SARS-CoV-2 virus that causes COVID-19. Institutional protocols and algorithms that pertain to the evaluation of patients at risk for COVID-19 are in a state of rapid change based on information released by regulatory bodies including the CDC and federal and state organizations. These policies and algorithms were followed during the patient's care in the ED.  CC: right flank pain  Record review:  H/o ectopic pregnancy  Initial concern for: Biliary colic/cholecystitis, renal colic, ectopic preg, atypical appendicitis  Low suspicion for: SBO, pancreatitis  Work up as above, notable for:  Leukocytosis hcg neg US with normal GB. Right hydronephrosis  CT stone study for first stone - notable for right UVJ stone  **Labs & imaging results that were available during my care of the patient were reviewed by me and considered in my medical decision making.    Management: IVF, Zofran, and Toradol  Reassessment:  Pain resolved.  6:00 AM  Pain returned. Additional pain meds given   7:15 AM  Pain controlled.  The patient appears reasonably screened and/or stabilized for discharge and I doubt any other medical condition or other [facility] requiring further screening, evaluation, or treatment in the ED at this time prior to discharge.  The patient is safe for discharge with strict return precautions.      Final Clinical Impression(s) / ED Diagnoses Final diagnoses:  Hydronephrosis with urinary obstruction due to ureteral calculus    The patient appears reasonably screened and/or stabilized for discharge and I doubt any other medical condition or other [facility] requiring further screening, evaluation, or treatment in the ED at this time prior to discharge.  Disposition: Discharge   Condition: Good  I have discussed the results, Dx and Tx plan with the patient who expressed understanding and agree(s) with the plan. Discharge instructions discussed at great length. The patient was given strict return precautions who verbalized understanding of the instructions. No further questions at time of discharge.  ED Discharge Orders         Ordered    ondansetron (ZOFRAN ODT) 4 MG disintegrating tablet  Every 8 hours PRN     11/05/18 0712    tamsulosin (FLOMAX) 0.4 MG CAPS capsule  Daily     11/05/18 0712    ibuprofen (ADVIL) 600 MG tablet  Every 6 hours PRN     11/05/18 0712    HYDROcodone-acetaminophen (NORCO/VICODIN) 5-325 MG tablet  Every 8 hours PRN     11/05/18 [id number]          [facility] reviewed and no active prescriptions noted.   Follow Up: Crista ElliotBell, Eugene D III, MD [address] [place] [id number] [phone]  Schedule an appointment as soon as possible for a visit        This chart was dictated using voice recognition software.  Despite best efforts to proofread,  errors can occur which can change the documentation meaning.   Nira Connardama, Rufus Beske Eduardo, MD 11/05/18 [phone]

## 2018-11-05 NOTE — ED Notes (Signed)
Pt is experiencing emesis again and pain had returned.

## 2019-01-02 ENCOUNTER — Other Ambulatory Visit: Payer: Self-pay

## 2019-01-02 ENCOUNTER — Other Ambulatory Visit (HOSPITAL_COMMUNITY)
Admission: RE | Admit: 2019-01-02 | Discharge: 2019-01-02 | Disposition: A | Discharge status: Home / Self Care | Payer: Commercial Managed Care - PPO | Source: Ambulatory Visit | Attending: Adult Health Nurse Practitioner | Admitting: Adult Health Nurse Practitioner

## 2019-01-02 ENCOUNTER — Encounter: Payer: Self-pay | Admitting: Adult Health Nurse Practitioner

## 2019-01-02 ENCOUNTER — Ambulatory Visit (INDEPENDENT_AMBULATORY_CARE_PROVIDER_SITE_OTHER): Payer: Commercial Managed Care - PPO | Admitting: Adult Health Nurse Practitioner

## 2019-01-02 VITALS — BP 118/70 | HR 99 | Temp 98.7°F | Ht 60.0 in | Wt 115.8 lb

## 2019-01-02 DIAGNOSIS — Z01419 Encounter for gynecological examination (general) (routine) without abnormal findings: Secondary | ICD-10-CM

## 2019-01-02 DIAGNOSIS — N926 Irregular menstruation, unspecified: Secondary | ICD-10-CM

## 2019-01-02 DIAGNOSIS — Z23 Encounter for immunization: Secondary | ICD-10-CM

## 2019-01-02 DIAGNOSIS — Z01411 Encounter for gynecological examination (general) (routine) with abnormal findings: Secondary | ICD-10-CM | POA: Diagnosis not present

## 2019-01-02 DIAGNOSIS — Z124 Encounter for screening for malignant neoplasm of cervix: Secondary | ICD-10-CM | POA: Insufficient documentation

## 2019-01-02 HISTORY — DX: Encounter for gynecological examination (general) (routine) without abnormal findings: Z01.419

## 2019-01-02 HISTORY — DX: Irregular menstruation, unspecified: N92.6

## 2019-01-02 LAB — POCT URINALYSIS DIP (CLINITEK)
Bilirubin, UA: NEGATIVE
Glucose, UA: NEGATIVE mg/dL
Ketones, POC UA: NEGATIVE mg/dL
Leukocytes, UA: NEGATIVE
Nitrite, UA: NEGATIVE
POC PROTEIN,UA: NEGATIVE
Spec Grav, UA: >=1.03 — AB (ref 1.010–1.025)
Urobilinogen, UA: 0.2 E.U./dL
pH, UA: 6 (ref 5.0–8.0)

## 2019-01-02 LAB — POCT URINE PREGNANCY: Preg Test, Ur: NEGATIVE

## 2019-01-02 MED ORDER — PRENATAL FORMULA A-FREE 9-0.267 MG PO TABS: 1.0000 | ORAL_TABLET | Freq: Every day | ORAL | 3 refills | Status: AC

## 2019-01-02 NOTE — Progress Notes (Signed)
Chief Complaint  Patient presents with  . Oligomenorrhea    Pt stated mentrual cycle is late 1 week--no pain    HPI  Patient presents to establish care, for her annual physical with PAP, and concern menses is late.    She notes that her periods are very regular lasting 5 days every 28 days.  Currently, 1 week late with negative pregnancy test at home.  Symptoms of sore nipples, fatigue, nausea, and bloating x 1-2 weeks.  She has been with her boyfriend x 1 year.  She is not planning on pregnancy but would be okay if she is pregnant.  Declines Emergency contraception or contraceptive counseling.  PMH for ectopic pregnancy in the past with concern about the symptoms should she have another ectopic.  Urine pregnancy test in office today is negative.  Patient is due for her PAP.  No previous hx.  Will get G/C Chlamydia screening as well.  Hx of PID.  No condom use.   Past Medical History:  Diagnosis Date  . Ectopic pregnancy   . Medical history non-contributory   . Missed period 01/02/2019  . PID (acute pelvic inflammatory disease)   . Women's annual routine gynecological examination 01/02/2019    Current Outpatient Medications  Medication Sig Dispense Refill  . vitamin C (ASCORBIC ACID) 500 MG tablet Take 500 mg by mouth daily.    . Prenatal w/o A Vit-Fe Fum-FA (PRENATAL FORMULA A-FREE) 9-0.267 MG TABS Take 1 tablet by mouth daily. 30 tablet 3   No current facility-administered medications for this visit.     Allergies: No Known Allergies  Past Surgical History:  Procedure Laterality Date  . NO PAST SURGERIES      Social History   Socioeconomic History  . Marital status: Single    Spouse name: Not on file  . Number of children: Not on file  . Years of education: Not on file  . Highest education level: Not on file  Occupational History  . Not on file  Social Needs  . Financial resource strain: Not on file  . Food insecurity    Worry: Not on file    Inability: Not on  file  . Transportation needs    Medical: Not on file    Non-medical: Not on file  Tobacco Use  . Smoking status: Never Smoker  . Smokeless tobacco: Never Used  Substance and Sexual Activity  . Alcohol use: No  . Drug use: No  . Sexual activity: Yes    Birth control/protection: Condom  Lifestyle  . Physical activity    Days per week: Not on file    Minutes per session: Not on file  . Stress: Not on file  Relationships  . Social Herbalist on phone: Not on file    Gets together: Not on file    Attends religious service: Not on file    Active member of club or organization: Not on file    Attends meetings of clubs or organizations: Not on file    Relationship status: Not on file  Other Topics Concern  . Not on file  Social History Narrative  . Not on file    Family History  Problem Relation Age of Onset  . Cancer Other      Review of Systems  Constitutional: Negative for activity change, appetite change, chills and fever. Positive for fatigue.  HENT: Negative for congestion, nosebleeds, trouble swallowing and voice change.   Respiratory: Negative for cough, shortness  of breath and wheezing.   Gastrointestinal: Negative for diarrhea, No vomiting.  Positive for nausea and bloating .  Genitourinary: Negative for difficulty urinating, dysuria, flank pain and hematuria.  Musculoskeletal: Negative for back pain, joint swelling and neck pain.  Neurological: Negative for  speech difficulty, light-headedness and numbness. Positive for dizziness when getting up too quickly See HPI. All other review of systems negative.   Objective: Vitals:   01/02/19 0906  BP: 118/70  Pulse: 99  Temp: 98.7 F (37.1 C)  SpO2: 100%  Weight: 115 lb 12.8 oz (52.5 kg)  Height: 5' (1.524 m)    Physical Exam  Physical Exam  Constitutional: Oriented to person, place, and time. Appears well-developed and well-nourished.  HENT:  Head: Normocephalic and atraumatic.  Eyes:  Conjunctivae and EOM are normal.  Cardiovascular: Normal rate, regular rhythm, normal heart sounds and intact distal pulses.  No murmur heard. GI:  Abdomen soft, non-tender.  GU:  External labia without lesion or irritation.  PAP, GC Chlamydia obtained.  Some thick white discharge surrounding cervix.  No CMT.  Pulmonary/Chest: Effort normal and breath sounds normal. No stridor. No respiratory distress. Has no wheezes.  Neurological: Is alert and oriented to person, place, and time.  Skin: Skin is warm. Capillary refill takes less than 2 seconds.  Psychiatric: Has a normal mood and affect. Behavior is normal. Judgment and thought content normal.    Assessment and Plan Sabrina Hahn was seen today for oligomenorrhea.  Diagnoses and all orders for this visit:  Pap smear for cervical cancer screening -     Cytology - PAP([place]) -     POCT urine pregnancy -     POCT URINALYSIS DIP ([facility]) -     Beta hCG quant (ref lab) -     CBC with Differential/Platelet -     CMP14+EGFR -     Hemoglobin A1c -     TSH  Women's annual routine gynecological examination -     POCT URINALYSIS DIP ([facility]) -     Beta hCG quant (ref lab) -     CBC with Differential/Platelet -     CMP14+EGFR -     Hemoglobin A1c -     TSH  Missed period  Other orders -     Tdap vaccine greater than or equal to 7yo IM -     Prenatal w/o A Vit-Fe Fum-FA (PRENATAL FORMULA A-FREE) 9-0.267 MG TABS; Take 1 tablet by mouth daily.   Will f/u on labs.  Patient was given information to retry MyChart.  All questions answered.  Patient is satisfied with plan.     Glyn Ade

## 2019-01-02 NOTE — Progress Notes (Signed)

## 2019-01-02 NOTE — Patient Instructions (Addendum)
   If you have lab work done today you will be contacted with your lab results within the next 2 weeks.  If you have not heard from us then please contact us. The fastest way to get your results is to register for My Chart.   IF you received an x-ray today, you will receive an invoice from [facility]. Please contact [facility] at [phone] with questions or concerns regarding your invoice.   IF you received labwork today, you will receive an invoice from [facility]. Please contact [facility] at [phone] with questions or concerns regarding your invoice.   Our billing staff will not be able to assist you with questions regarding bills from these companies.  You will be contacted with the lab results as soon as they are available. The fastest way to get your results is to activate your My Chart account. Instructions are located on the last page of this paperwork. If you have not heard from us regarding the results in 2 weeks, please contact this office.      Health Maintenance, Female Adopting a healthy lifestyle and getting preventive care are important in promoting health and wellness. Ask your health care provider about:  The right schedule for you to have regular tests and exams.  Things you can do on your own to prevent diseases and keep yourself healthy. What should I know about diet, weight, and exercise? Eat a healthy diet   Eat a diet that includes plenty of vegetables, fruits, low-fat dairy products, and lean protein.  Do not eat a lot of foods that are high in solid fats, added sugars, or sodium. Maintain a healthy weight Body mass index (BMI) is used to identify weight problems. It estimates body fat based on height and weight. Your health care provider can help determine your BMI and help you achieve or maintain a healthy weight. Get regular exercise Get regular exercise. This is one of the most important things you can do for your health. Most  adults should:  Exercise for at least 150 minutes each week. The exercise should increase your heart rate and make you sweat (moderate-intensity exercise).  Do strengthening exercises at least twice a week. This is in addition to the moderate-intensity exercise.  Spend less time sitting. Even light physical activity can be beneficial. Watch cholesterol and blood lipids Have your blood tested for lipids and cholesterol at 24 years of age, then have this test every 5 years. Have your cholesterol levels checked more often if:  Your lipid or cholesterol levels are high.  You are older than 24 years of age.  You are at high risk for heart disease. What should I know about cancer screening? Depending on your health history and family history, you may need to have cancer screening at various ages. This may include screening for:  Breast cancer.  Cervical cancer.  Colorectal cancer.  Skin cancer.  Lung cancer. What should I know about heart disease, diabetes, and high blood pressure? Blood pressure and heart disease  High blood pressure causes heart disease and increases the risk of stroke. This is more likely to develop in people who have high blood pressure readings, are of African descent, or are overweight.  Have your blood pressure checked: ? Every 3-5 years if you are 18-39 years of age. ? Every year if you are 40 years old or older. Diabetes Have regular diabetes screenings. This checks your fasting blood sugar level. Have the screening done:  Once every   three years after age 40 if you are at a normal weight and have a low risk for diabetes.  More often and at a younger age if you are overweight or have a high risk for diabetes. What should I know about preventing infection? Hepatitis B If you have a higher risk for hepatitis B, you should be screened for this virus. Talk with your health care provider to find out if you are at risk for hepatitis B infection. Hepatitis  C Testing is recommended for:  Everyone born from 1945 through 1965.  Anyone with known risk factors for hepatitis C. Sexually transmitted infections (STIs)  Get screened for STIs, including gonorrhea and chlamydia, if: ? You are sexually active and are younger than 24 years of age. ? You are older than 24 years of age and your health care provider tells you that you are at risk for this type of infection. ? Your sexual activity has changed since you were last screened, and you are at increased risk for chlamydia or gonorrhea. Ask your health care provider if you are at risk.  Ask your health care provider about whether you are at high risk for HIV. Your health care provider may recommend a prescription medicine to help prevent HIV infection. If you choose to take medicine to prevent HIV, you should first get tested for HIV. You should then be tested every 3 months for as long as you are taking the medicine. Pregnancy  If you are about to stop having your period (premenopausal) and you may become pregnant, seek counseling before you get pregnant.  Take 400 to 800 micrograms (mcg) of folic acid every day if you become pregnant.  Ask for birth control (contraception) if you want to prevent pregnancy. Osteoporosis and menopause Osteoporosis is a disease in which the bones lose minerals and strength with aging. This can result in bone fractures. If you are 65 years old or older, or if you are at risk for osteoporosis and fractures, ask your health care provider if you should:  Be screened for bone loss.  Take a calcium or vitamin D supplement to lower your risk of fractures.  Be given hormone replacement therapy (HRT) to treat symptoms of menopause. Follow these instructions at home: Lifestyle  Do not use any products that contain nicotine or tobacco, such as cigarettes, e-cigarettes, and chewing tobacco. If you need help quitting, ask your health care provider.  Do not use street  drugs.  Do not share needles.  Ask your health care provider for help if you need support or information about quitting drugs. Alcohol use  Do not drink alcohol if: ? Your health care provider tells you not to drink. ? You are pregnant, may be pregnant, or are planning to become pregnant.  If you drink alcohol: ? Limit how much you use to 0-1 drink a day. ? Limit intake if you are breastfeeding.  Be aware of how much alcohol is in your drink. In the [place] one drink equals one 12 oz bottle of beer (355 mL), one 5 oz glass of wine (148 mL), or one 1 oz glass of hard liquor (44 mL). General instructions  Schedule regular health, dental, and eye exams.  Stay current with your vaccines.  Tell your health care provider if: ? You often feel depressed. ? You have ever been abused or do not feel safe at home. Summary  Adopting a healthy lifestyle and getting preventive care are important in promoting health and wellness.    Follow your health care provider's instructions about healthy diet, exercising, and getting tested or screened for diseases.  Follow your health care provider's instructions on monitoring your cholesterol and blood pressure. This information is not intended to replace advice given to you by your health care provider. Make sure you discuss any questions you have with your health care provider. Document Released: 09/19/2010 Document Revised: 02/27/2018 Document Reviewed: 02/27/2018 Elsevier Patient Education  2020 Elsevier Inc.  

## 2019-01-03 ENCOUNTER — Encounter: Payer: Commercial Managed Care - PPO | Admitting: Adult Health Nurse Practitioner

## 2019-01-03 LAB — CMP14+EGFR
ALT: 8 IU/L (ref 0–32)
AST: 13 IU/L (ref 0–40)
Albumin/Globulin Ratio: 2 (ref 1.2–2.2)
Albumin: 4.3 g/dL (ref 3.9–5.0)
Alkaline Phosphatase: 51 IU/L (ref 39–117)
BUN/Creatinine Ratio: 28 — ABNORMAL HIGH (ref 9–23)
BUN: 19 mg/dL (ref 6–20)
Bilirubin Total: <0.2 mg/dL (ref 0.0–1.2)
CO2: 18 mmol/L — ABNORMAL LOW (ref 20–29)
Calcium: 9.2 mg/dL (ref 8.7–10.2)
Chloride: 108 mmol/L — ABNORMAL HIGH (ref 96–106)
Creatinine, Ser: 0.68 mg/dL (ref 0.57–1.00)
GFR calc Af Amer: 142 mL/min/{1.73_m2} (ref >59)
GFR calc non Af Amer: 123 mL/min/{1.73_m2} (ref >59)
Globulin, Total: 2.2 g/dL (ref 1.5–4.5)
Glucose: 93 mg/dL (ref 65–99)
Potassium: 3.9 mmol/L (ref 3.5–5.2)
Sodium: 138 mmol/L (ref 134–144)
Total Protein: 6.5 g/dL (ref 6.0–8.5)

## 2019-01-03 LAB — CBC WITH DIFFERENTIAL/PLATELET
Basophils Absolute: 0 10*3/uL (ref 0.0–0.2)
Basos: 0 %
EOS (ABSOLUTE): 0.3 10*3/uL (ref 0.0–0.4)
Eos: 3 %
Hematocrit: 39.9 % (ref 34.0–46.6)
Hemoglobin: 12.6 g/dL (ref 11.1–15.9)
Immature Grans (Abs): 0 10*3/uL (ref 0.0–0.1)
Immature Granulocytes: 0 %
Lymphocytes Absolute: 2.6 10*3/uL (ref 0.7–3.1)
Lymphs: 24 %
MCH: 28.6 pg (ref 26.6–33.0)
MCHC: 31.6 g/dL (ref 31.5–35.7)
MCV: 91 fL (ref 79–97)
Monocytes Absolute: 0.9 10*3/uL (ref 0.1–0.9)
Monocytes: 8 %
Neutrophils Absolute: 7.1 10*3/uL — ABNORMAL HIGH (ref 1.4–7.0)
Neutrophils: 65 %
Platelets: 313 10*3/uL (ref 150–450)
RBC: 4.4 x10E6/uL (ref 3.77–5.28)
RDW: 13.4 % (ref 11.7–15.4)
WBC: 11 10*3/uL — ABNORMAL HIGH (ref 3.4–10.8)

## 2019-01-03 LAB — BETA HCG QUANT (REF LAB): hCG Quant: <1 m[IU]/mL

## 2019-01-03 LAB — HEMOGLOBIN A1C
Est. average glucose Bld gHb Est-mCnc: 111 mg/dL
Hgb A1c MFr Bld: 5.5 % (ref 4.8–5.6)

## 2019-01-03 LAB — CYTOLOGY - PAP
Adequacy: ABSENT
Diagnosis: NEGATIVE

## 2019-01-03 LAB — TSH: TSH: 1.18 u[IU]/mL (ref 0.450–4.500)

## 2019-01-06 ENCOUNTER — Telehealth: Payer: Medicaid Other | Admitting: Family

## 2019-01-06 ENCOUNTER — Encounter (INDEPENDENT_AMBULATORY_CARE_PROVIDER_SITE_OTHER): Payer: Self-pay

## 2019-01-06 DIAGNOSIS — Z20822 Contact with and (suspected) exposure to covid-19: Secondary | ICD-10-CM

## 2019-01-06 DIAGNOSIS — Z20828 Contact with and (suspected) exposure to other viral communicable diseases: Secondary | ICD-10-CM

## 2019-01-06 NOTE — Progress Notes (Signed)
E-Visit for Corona Virus Screening   Your current symptoms could be consistent with the coronavirus.  Many health care providers can now test patients at their office but not all are.  [facility] has multiple testing sites. For information on our COVID testing locations and hours go to [email or web address]  Please quarantine yourself while awaiting your test results.  We are enrolling you in our MyChart Home Montioring for COVID19 . Daily you will receive a questionnaire within the MyChart website. Our COVID 19 response team willl be monitoriing your responses daily.   You can go to one of the  testing sites listed below, while they are opened (see hours). You do not need an order and will stay in your car during the test. You do need to self isolate until your results return and if positive 14 days from when your symptoms started and until you are 3 days symptom free.   Testing Locations (Monday - Friday, 8 a.m. - 3:30 p.m.) . [place]: [facility], [address], [place], [place]  . [place]: [facility], [address], [place], [place] (entrance off [address])  . [place]: (Closed each Monday): Testing site relocated to the short stay covered drive at [facility]. (Use the [address] entrance to [facility] next to [facility].)   COVID-19 is a respiratory illness with symptoms that are similar to the flu. Symptoms are typically mild to moderate, but there have been cases of severe illness and death due to the virus. The following symptoms may appear 2-14 days after exposure: . Fever . Cough . Shortness of breath or difficulty breathing . Chills . Repeated shaking with chills . Muscle pain . Headache . Sore throat . New loss of taste or smell . Fatigue . Congestion or runny nose . Nausea or vomiting . Diarrhea  It is vitally important that if you feel that  you have an infection such as this virus or any other virus that you stay home and away from places where you may spread it to others.  You should self-quarantine for 14 days if you have symptoms that could potentially be coronavirus or have been in close contact a with a person diagnosed with COVID-19 within the last 2 weeks. You should avoid contact with people age 65 and older.   You should wear a mask or cloth face covering over your nose and mouth if you must be around other people or animals, including pets (even at home). Try to stay at least 6 feet away from other people. This will protect the people around you.   You may also take acetaminophen (Tylenol) as needed for fever.   Reduce your risk of any infection by using the same precautions used for avoiding the common cold or flu:  . Wash your hands often with soap and warm water for at least 20 seconds.  If soap and water are not readily available, use an alcohol-based hand sanitizer with at least 60% alcohol.  . If coughing or sneezing, cover your mouth and nose by coughing or sneezing into the elbow areas of your shirt or coat, into a tissue or into your sleeve (not your hands). . Avoid shaking hands with others and consider head nods or verbal greetings only. . Avoid touching your eyes, nose, or mouth with unwashed hands.  . Avoid close contact with people who are sick. . Avoid places or events with large numbers   of people in one location, like concerts or sporting events. . Carefully consider travel plans you have or are making. . If you are planning any travel outside or inside the [place], visit the CDC's Travelers' Health webpage for the latest health notices. . If you have some symptoms but not all symptoms, continue to monitor at home and seek medical attention if your symptoms worsen. . If you are having a medical emergency, call 911.  HOME CARE . Only take medications as instructed by your medical team. . Drink plenty of fluids  and get plenty of rest. . A steam or ultrasonic humidifier can help if you have congestion.   GET HELP RIGHT AWAY IF YOU HAVE EMERGENCY WARNING SIGNS** FOR COVID-19. If you or someone is showing any of these signs seek emergency medical care immediately. Call 911 or proceed to your closest emergency facility if: . You develop worsening high fever. . Trouble breathing . Bluish lips or face . Persistent pain or pressure in the chest . New confusion . Inability to wake or stay awake . You cough up blood. . Your symptoms become more severe  **This list is not all possible symptoms. Contact your medical provider for any symptoms that are sever or concerning to you.   MAKE SURE YOU   Understand these instructions.  Will watch your condition.  Will get help right away if you are not doing well or get worse.  Your e-visit answers were reviewed by a board certified advanced clinical practitioner to complete your personal care plan.  Depending on the condition, your plan could have included both over the counter or prescription medications.  If there is a problem please reply once you have received a response from your provider.  Your safety is important to us.  If you have drug allergies check your prescription carefully.    You can use MyChart to ask questions about today's visit, request a non-urgent call back, or ask for a work or school excuse for 24 hours related to this e-Visit. If it has been greater than 24 hours you will need to follow up with your provider, or enter a new e-Visit to address those concerns. You will get an e-mail in the next two days asking about your experience.  I hope that your e-visit has been valuable and will speed your recovery. Thank you for using e-visits.    

## 2019-01-07 ENCOUNTER — Encounter (INDEPENDENT_AMBULATORY_CARE_PROVIDER_SITE_OTHER): Payer: Self-pay

## 2019-01-08 ENCOUNTER — Encounter (INDEPENDENT_AMBULATORY_CARE_PROVIDER_SITE_OTHER): Payer: Self-pay

## 2019-01-08 NOTE — Progress Notes (Signed)
Your pregnancy result was negative.  However, your labs did show an elevated White Blood Cell Count and an elevated Neutrophil count.  This can be evaluated in an infection or other illness.  If you are having any symptoms of an infection, please call or return for evaluation.   Otherwise, I would repeat this CBC before Christmas.

## 2019-07-29 ENCOUNTER — Telehealth: Payer: Commercial Managed Care - PPO | Admitting: Adult Health Nurse Practitioner

## 2019-07-29 ENCOUNTER — Telehealth (INDEPENDENT_AMBULATORY_CARE_PROVIDER_SITE_OTHER): Payer: Self-pay | Admitting: Family Medicine

## 2019-07-29 ENCOUNTER — Other Ambulatory Visit: Payer: Self-pay

## 2019-07-29 ENCOUNTER — Encounter: Payer: Self-pay | Admitting: Family Medicine

## 2019-07-29 DIAGNOSIS — R519 Headache, unspecified: Secondary | ICD-10-CM

## 2019-07-29 DIAGNOSIS — R195 Other fecal abnormalities: Secondary | ICD-10-CM

## 2019-07-29 MED ORDER — CYCLOBENZAPRINE HCL 10 MG PO TABS: 10.0000 mg | ORAL_TABLET | Freq: Three times a day (TID) | ORAL | 1 refills | Status: DC | PRN

## 2019-07-29 NOTE — Progress Notes (Signed)
Virtual Visit Note  I connected with patient on 07/29/19 at 543pm by video doximity and verified that I am speaking with the correct person using two identifiers. Sabrina Hahn is currently located at home and patient is currently with them during visit. The provider, Rutherford Guys, MD is located in their office at time of visit.  I discussed the limitations, risks, security and privacy concerns of performing an evaluation and management service by telephone and the availability of in person appointments. I also discussed with the patient that there may be a patient responsible charge related to this service. The patient expressed understanding and agreed to proceed.   I provided 16 minutes of non-face-to-face time during this encounter.  Chief Complaint  Patient presents with  . Headache    started 1.5 weeks ago daily lasting 30 mins to 24 hrs   . Back Pain    possible pulled muscle 3 days     HPI ? Has been having headaches for past 2 weeks or so  Having 2 different headaches:  1. Left sided towards occipitus, pressure, associated with left side neck pain, denies any inciting event Reports neck with normal ROM but pain goes up into her head and down her back 2. Right sided stabbing headaches, behind eye, associated with seeing bright stars and wanting to be in dark room but denies bright lights bothering her Denies any nausea, vomiting, phonophobia, dizziness, tinnitus, sinus pain, numbness, tingling, changes in speech, focal weakness  Denies any h/o migraines Smokes No BC Has been taking tylenol pm or advil pm every night - which helps Sometimes wakes up with very intense headaches   Having GI issues  Having runny/watery stools constantly for past several months No abd pain, nausea, vomiting, no blood, weight stable Sometimes stool are foamy and float preceded by very intense abd pain and blaoting Denies any constipation Cut back on greasy foods and take out Cut back  on spicy foods and salt  No Known Allergies  Prior to Admission medications   Medication Sig Start Date End Date Taking? Authorizing Provider  acetaminophen (TYLENOL) 325 MG tablet Take 650 mg by mouth every 6 (six) hours as needed.   Yes [provider]  diphenhydrAMINE (BENADRYL) 25 MG tablet Take 25 mg by mouth every 6 (six) hours as needed.   Yes [provider]  naproxen sodium (ALEVE) 220 MG tablet Take 220 mg by mouth.   Yes [provider]  vitamin C (ASCORBIC ACID) 500 MG tablet Take 500 mg by mouth daily.   Yes [provider]    Past Medical History:  Diagnosis Date  . Ectopic pregnancy   . Medical history non-contributory   . Missed period 01/02/2019  . PID (acute pelvic inflammatory disease)   . Women's annual routine gynecological examination 01/02/2019    Past Surgical History:  Procedure Laterality Date  . NO PAST SURGERIES      Social History   Tobacco Use  . Smoking status: Never Smoker  . Smokeless tobacco: Never Used  Substance Use Topics  . Alcohol use: No    Family History  Problem Relation Age of Onset  . Cancer Other     ROS Per hpi  Objective  Vitals as reported by the patient: none  GEN: AAOx3, NAD HEENT: [place]/AT, pupils are symmetrical, EOMI, non-icteric sclera Resp: breathing comfortably, speaking in full sentences Skin: no rashes noted, no pallor Psych: good eye contact, normal mood and affect  ASSESSMENT and  PLAN  1. Acute intractable headache, unspecified headache type Seems to have 2 types of headaches, 1 related to MSK cause and 2 migraines? Trial of flexeril, reviewed r/se/b. Discussed rebound headaches, d/c nsaids. ER precautions given. If not improving at next OV in 2 weeks, imaging given new headaches/waking up with headaches  2. Watery stools Discussed elimination diet. Start with milk products.  Other orders - cyclobenzaprine (FLEXERIL) 10 MG tablet; Take 1 tablet (10 mg total) by  mouth 3 (three) times daily as needed for muscle spasms.  FOLLOW-UP: 2 weeks   The above assessment and management plan was discussed with the patient. The patient verbalized understanding of and has agreed to the management plan. Patient is aware to call the clinic if symptoms persist or worsen. Patient is aware when to return to the clinic for a follow-up visit. Patient educated on when it is appropriate to go to the emergency department.     Myles Lipps, MD Primary Care at [facility] [address] [place], [place] [address] Ph.  [phone] Fax [phone]

## 2019-07-29 NOTE — Patient Instructions (Signed)
° ° ° °  If you have lab work done today you will be contacted with your lab results within the next 2 weeks.  If you have not heard from us then please contact us. The fastest way to get your results is to register for My Chart. ° ° °IF you received an x-ray today, you will receive an invoice from [facility]. Please contact [facility] at [phone] with questions or concerns regarding your invoice.  ° °IF you received labwork today, you will receive an invoice from [facility]. Please contact [facility] at [phone] with questions or concerns regarding your invoice.  ° °Our billing staff will not be able to assist you with questions regarding bills from these companies. ° °You will be contacted with the lab results as soon as they are available. The fastest way to get your results is to activate your My Chart account. Instructions are located on the last page of this paperwork. If you have not heard from us regarding the results in 2 weeks, please contact this office. °  ° ° ° °

## 2019-08-25 ENCOUNTER — Ambulatory Visit: Payer: Medicaid Other | Admitting: Registered Nurse

## 2019-08-26 ENCOUNTER — Encounter: Payer: Self-pay | Admitting: Registered Nurse

## 2019-10-04 ENCOUNTER — Inpatient Hospital Stay (HOSPITAL_COMMUNITY)
Admission: AD | Admit: 2019-10-04 | Discharge: 2019-10-04 | Disposition: A | Discharge status: Home / Self Care | Payer: Medicaid Other | Attending: Obstetrics & Gynecology | Admitting: Obstetrics & Gynecology

## 2019-10-04 ENCOUNTER — Encounter (HOSPITAL_COMMUNITY): Payer: Self-pay | Admitting: Obstetrics & Gynecology

## 2019-10-04 ENCOUNTER — Other Ambulatory Visit: Payer: Self-pay

## 2019-10-04 ENCOUNTER — Inpatient Hospital Stay (HOSPITAL_COMMUNITY): Payer: Medicaid Other

## 2019-10-04 DIAGNOSIS — O09292 Supervision of pregnancy with other poor reproductive or obstetric history, second trimester: Secondary | ICD-10-CM | POA: Diagnosis not present

## 2019-10-04 DIAGNOSIS — R109 Unspecified abdominal pain: Secondary | ICD-10-CM | POA: Diagnosis not present

## 2019-10-04 DIAGNOSIS — O26891 Other specified pregnancy related conditions, first trimester: Secondary | ICD-10-CM | POA: Insufficient documentation

## 2019-10-04 DIAGNOSIS — O468X1 Other antepartum hemorrhage, first trimester: Secondary | ICD-10-CM

## 2019-10-04 DIAGNOSIS — O208 Other hemorrhage in early pregnancy: Secondary | ICD-10-CM | POA: Insufficient documentation

## 2019-10-04 DIAGNOSIS — Z8759 Personal history of other complications of pregnancy, childbirth and the puerperium: Secondary | ICD-10-CM | POA: Diagnosis not present

## 2019-10-04 DIAGNOSIS — Z3A01 Less than 8 weeks gestation of pregnancy: Secondary | ICD-10-CM | POA: Diagnosis not present

## 2019-10-04 DIAGNOSIS — N760 Acute vaginitis: Secondary | ICD-10-CM

## 2019-10-04 DIAGNOSIS — B9689 Other specified bacterial agents as the cause of diseases classified elsewhere: Secondary | ICD-10-CM | POA: Diagnosis not present

## 2019-10-04 DIAGNOSIS — Z3491 Encounter for supervision of normal pregnancy, unspecified, first trimester: Secondary | ICD-10-CM

## 2019-10-04 DIAGNOSIS — Z79899 Other long term (current) drug therapy: Secondary | ICD-10-CM | POA: Insufficient documentation

## 2019-10-04 DIAGNOSIS — O23591 Infection of other part of genital tract in pregnancy, first trimester: Secondary | ICD-10-CM | POA: Insufficient documentation

## 2019-10-04 LAB — HCG, QUANTITATIVE, PREGNANCY: hCG, Beta Chain, Quant, S: 38290 m[IU]/mL — ABNORMAL HIGH (ref <5)

## 2019-10-04 LAB — WET PREP, GENITAL
Sperm: NONE SEEN
Trich, Wet Prep: NONE SEEN
Yeast Wet Prep HPF POC: NONE SEEN

## 2019-10-04 LAB — URINALYSIS, ROUTINE W REFLEX MICROSCOPIC
Bacteria, UA: NONE SEEN
Bilirubin Urine: NEGATIVE
Glucose, UA: NEGATIVE mg/dL
Ketones, ur: NEGATIVE mg/dL
Leukocytes,Ua: NEGATIVE
Nitrite: NEGATIVE
Protein, ur: NEGATIVE mg/dL
Specific Gravity, Urine: 1.02 (ref 1.005–1.030)
pH: 6 (ref 5.0–8.0)

## 2019-10-04 LAB — CBC
HCT: 38.1 % (ref 36.0–46.0)
Hemoglobin: 12.9 g/dL (ref 12.0–15.0)
MCH: 30.3 pg (ref 26.0–34.0)
MCHC: 33.9 g/dL (ref 30.0–36.0)
MCV: 89.4 fL (ref 80.0–100.0)
Platelets: 334 10*3/uL (ref 150–400)
RBC: 4.26 MIL/uL (ref 3.87–5.11)
RDW: 14.5 % (ref 11.5–15.5)
WBC: 10.3 10*3/uL (ref 4.0–10.5)
nRBC: 0 % (ref 0.0–0.2)

## 2019-10-04 LAB — POCT PREGNANCY, URINE: Preg Test, Ur: POSITIVE — AB

## 2019-10-04 MED ORDER — METRONIDAZOLE 500 MG PO TABS
500.0000 mg | ORAL_TABLET | Freq: Two times a day (BID) | ORAL | 0 refills | Status: DC
Start: 2019-10-04 — End: 2020-05-28

## 2019-10-04 NOTE — MAU Provider Note (Signed)
History     CSN: [id number]  Arrival date and time: 10/04/19 3557   First Provider Initiated Contact with Patient 10/04/19 1005      Chief Complaint  Patient presents with  . Abdominal Pain   HPI Sabrina Hahn is a 24 y.o. G2P0010 at [redacted]w[redacted]d who presents with abdominal pain. She states she started cramping on Tuesday and it has progressively gotten worse. She states last night the pain was worse than menstrual cramps and wanted to be seen. She denies any leaking or bleeding. She reports a hx of an ectopic pregnancy in 2016 that was treated with MTX.  OB History    Gravida  2   Para      Term      Preterm      AB  1   Living  0     SAB      TAB      Ectopic  1   Multiple      Live Births              Past Medical History:  Diagnosis Date  . Ectopic pregnancy   . Medical history non-contributory   . Missed period 01/02/2019  . PID (acute pelvic inflammatory disease)   . Women's annual routine gynecological examination 01/02/2019    Past Surgical History:  Procedure Laterality Date  . NO PAST SURGERIES      Family History  Problem Relation Age of Onset  . Cancer Other     Social History   Tobacco Use  . Smoking status: Never Smoker  . Smokeless tobacco: Never Used  Substance Use Topics  . Alcohol use: No  . Drug use: No    Allergies: No Known Allergies  Medications Prior to Admission  Medication Sig Dispense Refill Last Dose  . acetaminophen (TYLENOL) 325 MG tablet Take 650 mg by mouth every 6 (six) hours as needed.     . cyclobenzaprine (FLEXERIL) 10 MG tablet Take 1 tablet (10 mg total) by mouth 3 (three) times daily as needed for muscle spasms. 30 tablet 1   . diphenhydrAMINE (BENADRYL) 25 MG tablet Take 25 mg by mouth every 6 (six) hours as needed.     . naproxen sodium (ALEVE) 220 MG tablet Take 220 mg by mouth.     . vitamin C (ASCORBIC ACID) 500 MG tablet Take 500 mg by mouth daily.       Review of Systems  Constitutional:  Negative.  Negative for fatigue and fever.  HENT: Negative.   Respiratory: Negative.  Negative for shortness of breath.   Cardiovascular: Negative.  Negative for chest pain.  Gastrointestinal: Positive for abdominal pain. Negative for constipation, diarrhea, nausea and vomiting.  Genitourinary: Negative.  Negative for dysuria, vaginal bleeding and vaginal discharge.  Neurological: Negative.  Negative for dizziness and headaches.   Physical Exam   Blood pressure 116/81, pulse 93, temperature 98.6 F (37 C), temperature source Oral, resp. rate 16, height 5' (1.524 m), weight 53.5 kg, last menstrual period 08/24/2019, SpO2 99 %.  Physical Exam Vitals and nursing note reviewed.  Constitutional:      General: She is not in acute distress.    Appearance: She is well-developed.  HENT:     Head: Normocephalic.  Eyes:     Pupils: Pupils are equal, round, and reactive to light.  Cardiovascular:     Rate and Rhythm: Normal rate and regular rhythm.     Heart sounds: Normal heart sounds.  Pulmonary:  Effort: Pulmonary effort is normal. No respiratory distress.     Breath sounds: Normal breath sounds.  Abdominal:     General: Bowel sounds are normal. There is no distension.     Palpations: Abdomen is soft.     Tenderness: There is no abdominal tenderness.  Skin:    General: Skin is warm and dry.  Neurological:     Mental Status: She is alert and oriented to person, place, and time.  Psychiatric:        Behavior: Behavior normal.        Thought Content: Thought content normal.        Judgment: Judgment normal.     MAU Course  Procedures Results for orders placed or performed during the hospital encounter of 10/04/19 (from the past 24 hour(s))  Pregnancy, urine POC     Status: Abnormal   Collection Time: 10/04/19  9:37 AM  Result Value Ref Range   Preg Test, Ur POSITIVE (A) NEGATIVE  Urinalysis, Routine w reflex microscopic     Status: Abnormal   Collection Time: 10/04/19  9:39  AM  Result Value Ref Range   Color, Urine YELLOW YELLOW   APPearance CLEAR CLEAR   Specific Gravity, Urine 1.020 1.005 - 1.030   pH 6.0 5.0 - 8.0   Glucose, UA NEGATIVE NEGATIVE mg/dL   Hgb urine dipstick SMALL (A) NEGATIVE   Bilirubin Urine NEGATIVE NEGATIVE   Ketones, ur NEGATIVE NEGATIVE mg/dL   Protein, ur NEGATIVE NEGATIVE mg/dL   Nitrite NEGATIVE NEGATIVE   Leukocytes,Ua NEGATIVE NEGATIVE   RBC / HPF 0-5 0 - 5 RBC/hpf   WBC, UA 0-5 0 - 5 WBC/hpf   Bacteria, UA NONE SEEN NONE SEEN   Squamous Epithelial / LPF 0-5 0 - 5   Mucus PRESENT   Wet prep, genital     Status: Abnormal   Collection Time: 10/04/19 10:14 AM   Specimen: PATH Cytology Cervicovaginal Ancillary Only  Result Value Ref Range   Yeast Wet Prep HPF POC NONE SEEN NONE SEEN   Trich, Wet Prep NONE SEEN NONE SEEN   Clue Cells Wet Prep HPF POC PRESENT (A) NONE SEEN   WBC, Wet Prep HPF POC MODERATE (A) NONE SEEN   Sperm NONE SEEN   CBC     Status: None   Collection Time: 10/04/19 10:24 AM  Result Value Ref Range   WBC 10.3 4.0 - 10.5 K/uL   RBC 4.26 3.87 - 5.11 MIL/uL   Hemoglobin 12.9 12.0 - 15.0 g/dL   HCT 38.2 36 - 46 %   MCV 89.4 80.0 - 100.0 fL   MCH 30.3 26.0 - 34.0 pg   MCHC 33.9 30.0 - 36.0 g/dL   RDW 50.5 39.7 - 67.3 %   Platelets 334 150 - 400 K/uL   nRBC 0.0 0.0 - 0.2 %  hCG, quantitative, pregnancy     Status: Abnormal   Collection Time: 10/04/19 10:24 AM  Result Value Ref Range   hCG, Beta Chain, Quant, S 38,290 (H) <5 mIU/mL   US OB LESS THAN 14 WEEKS WITH OB TRANSVAGINAL  Result Date: 10/04/2019 CLINICAL DATA:  Pelvic cramping for the past 4 days. Positive urine pregnancy test. Five weeks 6 days pregnant by last menstrual period. EXAM: OBSTETRIC <14 WK [place] AND TRANSVAGINAL OB US TECHNIQUE: Both transabdominal and transvaginal ultrasound examinations were performed for complete evaluation of the gestation as well as the maternal uterus, adnexal regions, and pelvic cul-de-sac. Transvaginal  technique was performed to  assess early pregnancy. COMPARISON:  None. FINDINGS: Intrauterine gestational sac: Visualized Yolk sac:  Visualized Embryo:  Visualized Cardiac Activity: Visualized Heart Rate: 111 bpm CRL:  3.4 mm   5 w   6 d                  [place] EDC: 05/30/2020 Subchorionic hemorrhage:  Small. Maternal uterus/adnexae: Normal appearing ovaries with a right ovarian corpus luteum noted. No abnormal free peritoneal fluid. IMPRESSION: 1. Single live intrauterine gestation with an estimated gestational age of [redacted] weeks and 6 days. 2. Small subchorionic hemorrhage. Electronically Signed   By: Beckie Salts M.D.   On: 10/04/2019 11:40    MDM UA, UPT CBC, HCG ABO/Rh- O Pos Wet prep and gc/chlamydia US OB Comp Less 14 weeks with Transvaginal   Assessment and Plan   1. Normal intrauterine pregnancy on prenatal ultrasound in first trimester   2. Abdominal pain affecting pregnancy   3. Subchorionic hematoma in first trimester, single or unspecified fetus   4. Bacterial vaginosis    -Discharge home in stable condition -Rx for metronidazole sent to patient's pharmacy -First trimester precautions discussed -Patient advised to follow-up with OB of choice to start prenatal care -Patient may return to MAU as needed or if her condition were to change or worsen   Rolm Bookbinder CNM 10/04/2019, 10:05 AM

## 2019-10-04 NOTE — MAU Note (Signed)
Sabrina Hahn is a 24 y.o. at [redacted]w[redacted]d here in MAU reporting: since Tuesday she has been having bad cramping at night. States during the day sometime she has light cramping but denies any at this time. No bleeding, no discharge/  LMP: 08/24/2019  Onset of complaint: Tuesday night  Pain score: 0/10  Vitals:   10/04/19 0947  BP: 116/81  Pulse: 93  Resp: 16  Temp: 98.6 F (37 C)  SpO2: 99%     Lab orders placed from triage: UPT UA

## 2019-10-04 NOTE — Discharge Instructions (Signed)
Safe Medications in Pregnancy  ° °Acne: °Benzoyl Peroxide °Salicylic Acid ° °Backache/Headache: °Tylenol: 2 regular strength every 4 hours OR °             2 Extra strength every 6 hours ° °Colds/Coughs/Allergies: °Benadryl (alcohol free) 25 mg every 6 hours as needed °Breath right strips °Claritin °Cepacol throat lozenges °Chloraseptic throat spray °Cold-Eeze- up to three times per day °Cough drops, alcohol free °Flonase (by prescription only) °Guaifenesin °Mucinex °Robitussin DM (plain only, alcohol free) °Saline nasal spray/drops °Sudafed (pseudoephedrine) & Actifed ** use only after [redacted] weeks gestation and if you do not have high blood pressure °Tylenol °Vicks Vaporub °Zinc lozenges °Zyrtec  ° °Constipation: °Colace °Ducolax suppositories °Fleet enema °Glycerin suppositories °Metamucil °Milk of magnesia °Miralax °Senokot °Smooth move tea ° °Diarrhea: °Kaopectate °Imodium A-D ° °*NO pepto Bismol ° °Hemorrhoids: °Anusol °Anusol HC °Preparation H °Tucks ° °Indigestion: °Tums °Maalox °Mylanta °Zantac  °Pepcid ° °Insomnia: °Benadryl (alcohol free) 25mg every 6 hours as needed °Tylenol PM °Unisom, no Gelcaps ° °Leg Cramps: °Tums °MagGel ° °Nausea/Vomiting:  °Bonine °Dramamine °Emetrol °Ginger extract °Sea bands °Meclizine  °Nausea medication to take during pregnancy:  °Unisom (doxylamine succinate 25 mg tablets) Take one tablet daily at bedtime. If symptoms are not adequately controlled, the dose can be increased to a maximum recommended dose of two tablets daily (1/2 tablet in the morning, 1/2 tablet mid-afternoon and one at bedtime). °Vitamin B6 100mg tablets. Take one tablet twice a day (up to 200 mg per day). ° °Skin Rashes: °Aveeno products °Benadryl cream or 25mg every 6 hours as needed °Calamine Lotion °1% cortisone cream ° °Yeast infection: °Gyne-lotrimin 7 °Monistat 7 ° ° °**If taking multiple medications, please check labels to avoid duplicating the same active ingredients °**take medication as directed on  the label °** Do not exceed 4000 mg of tylenol in 24 hours °**Do not take medications that contain aspirin or ibuprofen ° ° ° °Prenatal Care Providers ° °         °[facility]  ° Phone: [phone] ° °[facility]  ° Phone: [phone] ° °[facility]      ° Phone: [phone]   °         °[facility] [place]    ° Phone: [phone] °         °[facility]  ° Phone: [phone] ° °[facility] ° Phone: [phone] ° °[facility] °Phone: [phone] °    °[facility]  °Phone: [phone] ° °[facility]  °Phone: [phone] ° °[facility] °Phone: [phone] ° °[facility] °Phone: [phone] ° °[facility] °Phone: [phone] ° °[facility] °Phone: [phone] ° °[facility]  °Phone: [phone] °[facility] Prenatal Care Providers °         °[facility] locations:  °Hours may vary. Please call for an appointment ° °[facility] [place] ° [address]  °[phone] ° °[facility]  ° [address]  °[phone] ° °[facility]      ° [address] °[phone]   °         °[facility] [place]    ° [address] °[phone] °         °[facility] [place] [facility]  ° [address] °[phone] ° °[facility] ° [address] °[phone] °    °[facility]) ° [address]  ° [phone] °

## 2019-10-06 LAB — GC/CHLAMYDIA PROBE AMP (~~LOC~~) NOT AT ARMC
Chlamydia: NEGATIVE
Comment: NEGATIVE
Comment: NORMAL
Neisseria Gonorrhea: NEGATIVE

## 2019-12-22 LAB — OB RESULTS CONSOLE HIV ANTIBODY (ROUTINE TESTING): HIV: NONREACTIVE

## 2019-12-22 LAB — OB RESULTS CONSOLE RUBELLA ANTIBODY, IGM: Rubella: IMMUNE

## 2019-12-22 LAB — OB RESULTS CONSOLE GC/CHLAMYDIA
Chlamydia: NEGATIVE
Gonorrhea: NEGATIVE

## 2019-12-22 LAB — OB RESULTS CONSOLE HEPATITIS B SURFACE ANTIGEN: Hepatitis B Surface Ag: NEGATIVE

## 2020-03-20 NOTE — L&D Delivery Note (Signed)
Delivery Note Labor onset: 05/28/2020  Labor Onset Time: 0300 Complete dilation at 11:06 AM  Onset of pushing at 1113 FHR second stage Cat 2 Analgesia/Anesthesia intrapartum: N/A  Guided pushing with maternal urge. Delivery of a viable female at 1125. Fetal head delivered in ROA position. Shoulder dystocia relieved with McRoberts and Suprapubic.   Nuchal cord: N/A.  Infant placed on maternal abd, dried, and tactile stim. Spontaneous cry at delivery.  Cord double clamped after pulsation ceased and cut by  FOB.  4 RNs present for birth.  Cord blood sample collected Arterial cord blood sample collected: N/A  Placenta delivered Sabrina Hahn via Binnie Kand Maneuver, intact, with 3 VC.  Placenta to L&D. Uterine tone firm, bleeding moderated to heavy. Several clots expressed. Massage, 18mu pitocin IM and cytotec given. Bleeding now minimal.   2 degree laceration identified Anesthesia: 1% Lidocaine Repair:  3-0 vicryl  QBL/EBL (mL): 288 Complications: precipitous delivery with shoulder dystocia. Moderate- heavy bleeding after delivery. APGAR: APGAR (1 MIN): 8   APGAR (5 MINS): 9   APGAR (10 MINS):   7lbs 13oz, 3549gms, 19 inches  Mom to postpartum.  Baby to Couplet care / Skin to Skin  Dr Su Hilt aware of pt status and POC. Sabrina Munch Holshouser MSN, CNM 05/28/2020, 12:55 PM

## 2020-03-26 ENCOUNTER — Encounter (HOSPITAL_COMMUNITY): Payer: Self-pay | Admitting: Obstetrics and Gynecology

## 2020-03-26 ENCOUNTER — Other Ambulatory Visit: Payer: Self-pay

## 2020-03-26 ENCOUNTER — Inpatient Hospital Stay (HOSPITAL_COMMUNITY)
Admission: AD | Admit: 2020-03-26 | Discharge: 2020-03-26 | Disposition: A | Discharge status: Home / Self Care | Payer: Commercial Managed Care - PPO | Attending: Obstetrics and Gynecology | Admitting: Obstetrics and Gynecology

## 2020-03-26 DIAGNOSIS — M791 Myalgia, unspecified site: Secondary | ICD-10-CM | POA: Insufficient documentation

## 2020-03-26 DIAGNOSIS — O98513 Other viral diseases complicating pregnancy, third trimester: Secondary | ICD-10-CM | POA: Insufficient documentation

## 2020-03-26 DIAGNOSIS — U071 COVID-19: Secondary | ICD-10-CM | POA: Insufficient documentation

## 2020-03-26 DIAGNOSIS — Z3689 Encounter for other specified antenatal screening: Secondary | ICD-10-CM

## 2020-03-26 DIAGNOSIS — Z3A30 30 weeks gestation of pregnancy: Secondary | ICD-10-CM | POA: Insufficient documentation

## 2020-03-26 DIAGNOSIS — R519 Headache, unspecified: Secondary | ICD-10-CM | POA: Insufficient documentation

## 2020-03-26 LAB — COMPREHENSIVE METABOLIC PANEL
ALT: 14 U/L (ref 0–44)
AST: 19 U/L (ref 15–41)
Albumin: 2.7 g/dL — ABNORMAL LOW (ref 3.5–5.0)
Alkaline Phosphatase: 75 U/L (ref 38–126)
Anion gap: 12 (ref 5–15)
BUN: <5 mg/dL — ABNORMAL LOW (ref 6–20)
CO2: 18 mmol/L — ABNORMAL LOW (ref 22–32)
Calcium: 8.7 mg/dL — ABNORMAL LOW (ref 8.9–10.3)
Chloride: 102 mmol/L (ref 98–111)
Creatinine, Ser: 0.47 mg/dL (ref 0.44–1.00)
GFR, Estimated: >60 mL/min (ref >60)
Glucose, Bld: 88 mg/dL (ref 70–99)
Potassium: 3.8 mmol/L (ref 3.5–5.1)
Sodium: 132 mmol/L — ABNORMAL LOW (ref 135–145)
Total Bilirubin: 0.4 mg/dL (ref 0.3–1.2)
Total Protein: 5.8 g/dL — ABNORMAL LOW (ref 6.5–8.1)

## 2020-03-26 LAB — URINALYSIS, ROUTINE W REFLEX MICROSCOPIC
Bilirubin Urine: NEGATIVE
Glucose, UA: NEGATIVE mg/dL
Hgb urine dipstick: NEGATIVE
Ketones, ur: 20 mg/dL — AB
Nitrite: NEGATIVE
Protein, ur: NEGATIVE mg/dL
Specific Gravity, Urine: 1.01 (ref 1.005–1.030)
pH: 6 (ref 5.0–8.0)

## 2020-03-26 LAB — CBC WITH DIFFERENTIAL/PLATELET
Abs Immature Granulocytes: 0.18 10*3/uL — ABNORMAL HIGH (ref 0.00–0.07)
Basophils Absolute: 0 10*3/uL (ref 0.0–0.1)
Basophils Relative: 0 %
Eosinophils Absolute: 0 10*3/uL (ref 0.0–0.5)
Eosinophils Relative: 0 %
HCT: 27.2 % — ABNORMAL LOW (ref 36.0–46.0)
Hemoglobin: 9.5 g/dL — ABNORMAL LOW (ref 12.0–15.0)
Immature Granulocytes: 2 %
Lymphocytes Relative: 7 %
Lymphs Abs: 0.8 10*3/uL (ref 0.7–4.0)
MCH: 30.2 pg (ref 26.0–34.0)
MCHC: 34.9 g/dL (ref 30.0–36.0)
MCV: 86.3 fL (ref 80.0–100.0)
Monocytes Absolute: 1.3 10*3/uL — ABNORMAL HIGH (ref 0.1–1.0)
Monocytes Relative: 11 %
Neutro Abs: 9.5 10*3/uL — ABNORMAL HIGH (ref 1.7–7.7)
Neutrophils Relative %: 80 %
Platelets: 295 10*3/uL (ref 150–400)
RBC: 3.15 MIL/uL — ABNORMAL LOW (ref 3.87–5.11)
RDW: 13.6 % (ref 11.5–15.5)
WBC: 11.8 10*3/uL — ABNORMAL HIGH (ref 4.0–10.5)
nRBC: 0 % (ref 0.0–0.2)

## 2020-03-26 LAB — RESP PANEL BY RT-PCR (FLU A&B, COVID) ARPGX2
Influenza A by PCR: NEGATIVE
Influenza B by PCR: NEGATIVE
SARS Coronavirus 2 by RT PCR: POSITIVE — AB

## 2020-03-26 MED ORDER — ACETAMINOPHEN 500 MG PO TABS
1000.0000 mg | ORAL_TABLET | Freq: Once | ORAL | Status: AC
  Administered 2020-03-26: 1000 mg via ORAL
  Filled 2020-03-26: qty 2

## 2020-03-26 MED ORDER — ACETAMINOPHEN 325 MG PO TABS: 650.0000 mg | ORAL_TABLET | Freq: Four times a day (QID) | ORAL | 0 refills | Status: DC | PRN

## 2020-03-26 MED ORDER — CYCLOBENZAPRINE HCL 5 MG PO TABS
10.0000 mg | ORAL_TABLET | Freq: Once | ORAL | Status: AC
  Administered 2020-03-26: 10 mg via ORAL
  Filled 2020-03-26: qty 2

## 2020-03-26 MED ORDER — CYCLOBENZAPRINE HCL 10 MG PO TABS
10.0000 mg | ORAL_TABLET | Freq: Three times a day (TID) | ORAL | 1 refills | Status: DC | PRN
Start: 2020-03-26 — End: 2020-05-29

## 2020-03-26 NOTE — MAU Note (Addendum)
Pt stated she  having a headache and body ache yesterday. Fever 99.8. Denies any  SOB, cough or n/v. Good fetal movement reports and denies any cramping or ctx at this time

## 2020-03-26 NOTE — Discharge Instructions (Signed)
10 Things You Can Do to Manage Your COVID-19 Symptoms at Home If you have possible or confirmed COVID-19: 1. Stay home from work and school. And stay away from other public places. If you must go out, avoid using any kind of public transportation, ridesharing, or taxis. 2. Monitor your symptoms carefully. If your symptoms get worse, call your healthcare provider immediately. 3. Get rest and stay hydrated. 4. If you have a medical appointment, call the healthcare provider ahead of time and tell them that you have or may have COVID-19. 5. For medical emergencies, call 911 and notify the dispatch personnel that you have or may have COVID-19. 6. Cover your cough and sneezes with a tissue or use the inside of your elbow. 7. Wash your hands often with soap and water for at least 20 seconds or clean your hands with an alcohol-based hand sanitizer that contains at least 60% alcohol. 8. As much as possible, stay in a specific room and away from other people in your home. Also, you should use a separate bathroom, if available. If you need to be around other people in or outside of the home, wear a mask. 9. Avoid sharing personal items with other people in your household, like dishes, towels, and bedding. 10. Clean all surfaces that are touched often, like counters, tabletops, and doorknobs. Use household cleaning sprays or wipes according to the label instructions. cdc.gov/coronavirus 09/18/2018 This information is not intended to replace advice given to you by your health care provider. Make sure you discuss any questions you have with your health care provider. Document Revised: 02/20/2019 Document Reviewed: 02/20/2019 Elsevier Patient Education  2020 Elsevier Inc.   COVID-19 Frequently Asked Questions COVID-19 (coronavirus disease) is an infection that is caused by a large family of viruses. Some viruses cause illness in people and others cause illness in animals like camels, cats, and bats. In some  cases, the viruses that cause illness in animals can spread to humans. Where did the coronavirus come from? In December 2019, [place] told the World Health Organization (WHO) of several cases of lung disease (human respiratory illness). These cases were linked to an open seafood and livestock market in the city of [place]. The link to the seafood and livestock market suggests that the virus may have spread from animals to humans. However, since that first outbreak in December, the virus has also been shown to spread from person to person. What is the name of the disease and the virus? Disease name Early on, this disease was called novel coronavirus. This is because scientists determined that the disease was caused by a new (novel) respiratory virus. The World Health Organization (WHO) has now named the disease COVID-19, or coronavirus disease. Virus name The virus that causes the disease is called severe acute respiratory syndrome coronavirus 2 (SARS-CoV-2). More information on disease and virus naming World Health Organization (WHO): www.who.int/emergencies/diseases/novel-coronavirus-2019/technical-guidance/naming-the-coronavirus-disease-(covid-2019)-and-the-virus-that-causes-it Who is at risk for complications from coronavirus disease? Some people may be at higher risk for complications from coronavirus disease. This includes older adults and people who have chronic diseases, such as heart disease, diabetes, and lung disease. If you are at higher risk for complications, take these extra precautions:  Stay home as much as possible.  Avoid social gatherings and travel.  Avoid close contact with others. Stay at least 6 ft (2 m) away from others, if possible.  Wash your hands often with soap and water for at least 20 seconds.  Avoid touching your face, mouth, nose, or eyes.    Keep supplies on hand at home, such as food, medicine, and cleaning supplies.  If you must go out in public, wear a cloth  face covering or face mask. Make sure your mask covers your nose and mouth. How does coronavirus disease spread? The virus that causes coronavirus disease spreads easily from person to person (is contagious). You may catch the virus by:  Breathing in droplets from an infected person. Droplets can be spread by a person breathing, speaking, singing, coughing, or sneezing.  Touching something, like a table or a doorknob, that was exposed to the virus (contaminated) and then touching your mouth, nose, or eyes. Can I get the virus from touching surfaces or objects? There is still a lot that we do not know about the virus that causes coronavirus disease. Scientists are basing a lot of information on what they know about similar viruses, such as:  Viruses cannot generally survive on surfaces for long. They need a human body (host) to survive.  It is more likely that the virus is spread by close contact with people who are sick (direct contact), such as through: ? Shaking hands or hugging. ? Breathing in respiratory droplets that travel through the air. Droplets can be spread by a person breathing, speaking, singing, coughing, or sneezing.  It is less likely that the virus is spread when a person touches a surface or object that has the virus on it (indirect contact). The virus may be able to enter the body if the person touches a surface or object and then touches his or her face, eyes, nose, or mouth. Can a person spread the virus without having symptoms of the disease? It may be possible for the virus to spread before a person has symptoms of the disease, but this is most likely not the main way the virus is spreading. It is more likely for the virus to spread by being in close contact with people who are sick and breathing in the respiratory droplets spread by a person breathing, speaking, singing, coughing, or sneezing. What are the symptoms of coronavirus disease? Symptoms vary from person to  person and can range from mild to severe. Symptoms may include:  Fever or chills.  Cough.  Difficulty breathing or feeling short of breath.  Headaches, body aches, or muscle aches.  Runny or stuffy (congested) nose.  Sore throat.  New loss of taste or smell.  Nausea, vomiting, or diarrhea. These symptoms can appear anywhere from 2 to 14 days after you have been exposed to the virus. Some people may not have any symptoms. If you develop symptoms, call your health care provider. People with severe symptoms may need hospital care. Should I be tested for this virus? Your health care provider will decide whether to test you based on your symptoms, history of exposure, and your risk factors. How does a health care provider test for this virus? Health care providers will collect samples to send for testing. Samples may include:  Taking a swab of fluid from the back of your nose and throat, your nose, or your throat.  Taking fluid from the lungs by having you cough up mucus (sputum) into a sterile cup.  Taking a blood sample. Is there a treatment or vaccine for this virus? Currently, there is no vaccine to prevent coronavirus disease. Also, there are no medicines like antibiotics or antivirals to treat the virus. A person who becomes sick is given supportive care, which means rest and fluids. A person may also   relieve his or her symptoms by using over-the-counter medicines that treat sneezing, coughing, and runny nose. These are the same medicines that a person takes for the common cold. If you develop symptoms, call your health care provider. People with severe symptoms may need hospital care. What can I do to protect myself and my family from this virus?     You can protect yourself and your family by taking the same actions that you would take to prevent the spread of other viruses. Take the following actions:  Wash your hands often with soap and water for at least 20 seconds. If soap  and water are not available, use alcohol-based hand sanitizer.  Avoid touching your face, mouth, nose, or eyes.  Cough or sneeze into a tissue, sleeve, or elbow. Do not cough or sneeze into your hand or the air. ? If you cough or sneeze into a tissue, throw it away immediately and wash your hands.  Disinfect objects and surfaces that you frequently touch every day.  Stay away from people who are sick.  Avoid going out in public, follow guidance from your state and local health authorities.  Avoid crowded indoor spaces. Stay at least 6 ft (2 m) away from others.  If you must go out in public, wear a cloth face covering or face mask. Make sure your mask covers your nose and mouth.  Stay home if you are sick, except to get medical care. Call your health care provider before you get medical care. Your health care provider will tell you how long to stay home.  Make sure your vaccines are up to date. Ask your health care provider what vaccines you need. What should I do if I need to travel? Follow travel recommendations from your local health authority, the CDC, and WHO. Travel information and advice  Centers for Disease Control and Prevention (CDC): www.cdc.gov/coronavirus/2019-ncov/travelers/index.html  World Health Organization (WHO): www.who.int/emergencies/diseases/novel-coronavirus-2019/travel-advice Know the risks and take action to protect your health  You are at higher risk of getting coronavirus disease if you are traveling to areas with an outbreak or if you are exposed to travelers from areas with an outbreak.  Wash your hands often and practice good hygiene to lower the risk of catching or spreading the virus. What should I do if I am sick? General instructions to stop the spread of infection  Wash your hands often with soap and water for at least 20 seconds. If soap and water are not available, use alcohol-based hand sanitizer.  Cough or sneeze into a tissue, sleeve, or  elbow. Do not cough or sneeze into your hand or the air.  If you cough or sneeze into a tissue, throw it away immediately and wash your hands.  Stay home unless you must get medical care. Call your health care provider or local health authority before you get medical care.  Avoid public areas. Do not take public transportation, if possible.  If you can, wear a mask if you must go out of the house or if you are in close contact with someone who is not sick. Make sure your mask covers your nose and mouth. Keep your home clean  Disinfect objects and surfaces that are frequently touched every day. This may include: ? Counters and tables. ? Doorknobs and light switches. ? Sinks and faucets. ? Electronics such as phones, remote controls, keyboards, computers, and tablets.  Wash dishes in hot, soapy water or use a dishwasher. Air-dry your dishes.  Wash laundry in   hot water. Prevent infecting other household members  Let healthy household members care for children and pets, if possible. If you have to care for children or pets, wash your hands often and wear a mask.  Sleep in a different bedroom or bed, if possible.  Do not share personal items, such as razors, toothbrushes, deodorant, combs, brushes, towels, and washcloths. Where to find more information Centers for Disease Control and Prevention (CDC)  Information and news updates: www.cdc.gov/coronavirus/2019-ncov World Health Organization (WHO)  Information and news updates: www.who.int/emergencies/diseases/novel-coronavirus-2019  Coronavirus health topic: www.who.int/health-topics/coronavirus  Questions and answers on COVID-19: www.who.int/news-room/q-a-detail/q-a-coronaviruses  Global tracker: who.sprinklr.com American Academy of Pediatrics (AAP)  Information for families: www.healthychildren.org/English/health-issues/conditions/chest-lungs/Pages/2019-Novel-Coronavirus.aspx The coronavirus situation is changing rapidly. Check  your local health authority website or the CDC and WHO websites for updates and news. When should I contact a health care provider?  Contact your health care provider if you have symptoms of an infection, such as fever or cough, and you: ? Have been near anyone who is known to have coronavirus disease. ? Have come into contact with a person who is suspected to have coronavirus disease. ? Have traveled to an area where there is an outbreak of COVID-19. When should I get emergency medical care?  Get help right away by calling your local emergency services (911 in the [place] if you have: ? Trouble breathing. ? Pain or pressure in your chest. ? Confusion. ? Blue-tinged lips and fingernails. ? Difficulty waking from sleep. ? Symptoms that get worse. Let the emergency medical personnel know if you think you have coronavirus disease. Summary  A new respiratory virus is spreading from person to person and causing COVID-19 (coronavirus disease).  The virus that causes COVID-19 appears to spread easily. It spreads from one person to another through droplets from breathing, speaking, singing, coughing, or sneezing.  Older adults and those with chronic diseases are at higher risk of disease. If you are at higher risk for complications, take extra precautions.  There is currently no vaccine to prevent coronavirus disease. There are no medicines, such as antibiotics or antivirals, to treat the virus.  You can protect yourself and your family by washing your hands often, avoiding touching your face, and covering your coughs and sneezes. This information is not intended to replace advice given to you by your health care provider. Make sure you discuss any questions you have with your health care provider. Document Revised: 01/03/2019 Document Reviewed: 07/02/2018 Elsevier Patient Education  2020 Elsevier Inc.  

## 2020-03-26 NOTE — MAU Provider Note (Signed)
Chief Complaint:  Headache and Generalized Body Aches   Event Date/Time   First Provider Initiated Contact with Patient 03/26/20 1053     HPI: Sabrina Hahn is a 25 y.o. G2P0010 at [redacted]w[redacted]d who presents to maternity admissions reporting headache and body aches since yesterday as well as a low grade fever. She tried one Tylenol at midnight without relief of symptoms. She denies nausea, vomiting, back or abdominal pain. Also denies vaginal bleeding, leaking of fluid, decreased fetal movement, fever, falls, or other recent illness. She is not vaccinated against Covid-19.  Past Medical History:  Diagnosis Date  . Ectopic pregnancy   . Medical history non-contributory   . Missed period 01/02/2019  . PID (acute pelvic inflammatory disease)   . Women's annual routine gynecological examination 01/02/2019   OB History  Gravida Para Term Preterm AB Living  2       1 0  SAB IAB Ectopic Multiple Live Births      1        # Outcome Date GA Lbr Len/2nd Weight Sex Delivery Anes PTL Lv  2 Current           1 Ectopic            Past Surgical History:  Procedure Laterality Date  . NO PAST SURGERIES     Family History  Problem Relation Age of Onset  . Cancer Other   . Hypertension Mother   . Hypertension Father    Social History   Tobacco Use  . Smoking status: Never Smoker  . Smokeless tobacco: Never Used  Substance Use Topics  . Alcohol use: No  . Drug use: No   No Known Allergies No medications prior to admission.    I have reviewed patient's Past Medical Hx, Surgical Hx, Family Hx, Social Hx, medications and allergies.   ROS:  Review of Systems  Constitutional: Positive for chills, fatigue and fever.  HENT: Negative for congestion, rhinorrhea and sore throat.   Eyes: Positive for photophobia.  Respiratory: Negative for cough, chest tightness and shortness of breath.   Gastrointestinal: Negative for abdominal pain.  Genitourinary: Negative for pelvic pain.  Musculoskeletal:  Positive for myalgias.  Neurological: Positive for headaches. Negative for dizziness and syncope.  All other systems reviewed and are negative.  Physical Exam   Patient Vitals for the past 24 hrs:  BP Temp Pulse Resp SpO2  03/26/20 1200 - - - - 98 %  03/26/20 1000 (!) 102/52 - (!) 123 - 92 %  03/26/20 0959 - 98.1 F (36.7 C) - - -  03/26/20 0958 (!) 102/52 - (!) 124 - 96 %  03/26/20 0955 - - (!) 121 18 93 %   Constitutional: Well-developed, well-nourished but sleepy female in mild distress due to pain Cardiovascular: normal rate & rhythm, no murmur Respiratory: normal effort, lung sounds clear throughout GI: Abd soft, non-tender, gravid appropriate for gestational age. Pos BS x 4 MS: Extremities nontender, no edema, normal ROM Neurologic: Alert and oriented x 4. PERRLA GU: no CVA tenderness  Pelvic exam deferred  Fetal Tracing: reactive Baseline:130 Variability: moderate Accelerations: present Decelerations:none Toco: relaxed   Labs: Results for orders placed or performed during the hospital encounter of 03/26/20 (from the past 24 hour(s))  Resp Panel by RT-PCR (Flu A&B, Covid) Nasopharyngeal Swab     Status: Abnormal   Collection Time: 03/26/20 10:41 AM   Specimen: Nasopharyngeal Swab; Nasopharyngeal(NP) swabs in vial transport medium  Result Value Ref Range  SARS Coronavirus 2 by RT PCR POSITIVE (A) NEGATIVE   Influenza A by PCR NEGATIVE NEGATIVE   Influenza B by PCR NEGATIVE NEGATIVE  Urinalysis, Routine w reflex microscopic Urine, Clean Catch     Status: Abnormal   Collection Time: 03/26/20 11:07 AM  Result Value Ref Range   Color, Urine STRAW (A) YELLOW   APPearance CLOUDY (A) CLEAR   Specific Gravity, Urine 1.010 1.005 - 1.030   pH 6.0 5.0 - 8.0   Glucose, UA NEGATIVE NEGATIVE mg/dL   Hgb urine dipstick NEGATIVE NEGATIVE   Bilirubin Urine NEGATIVE NEGATIVE   Ketones, ur 20 (A) NEGATIVE mg/dL   Protein, ur NEGATIVE NEGATIVE mg/dL   Nitrite NEGATIVE NEGATIVE    Leukocytes,Ua LARGE (A) NEGATIVE   RBC / HPF 6-10 0 - 5 RBC/hpf   WBC, UA 21-50 0 - 5 WBC/hpf   Bacteria, UA FEW (A) NONE SEEN   Squamous Epithelial / LPF 21-50 0 - 5   WBC Clumps PRESENT    Mucus PRESENT   CBC with Differential/Platelet     Status: Abnormal   Collection Time: 03/26/20 11:13 AM  Result Value Ref Range   WBC 11.8 (H) 4.0 - 10.5 K/uL   RBC 3.15 (L) 3.87 - 5.11 MIL/uL   Hemoglobin 9.5 (L) 12.0 - 15.0 g/dL   HCT 27.2 (L) 36.0 - 46.0 %   MCV 86.3 80.0 - 100.0 fL   MCH 30.2 26.0 - 34.0 pg   MCHC 34.9 30.0 - 36.0 g/dL   RDW 13.6 11.5 - 15.5 %   Platelets 295 150 - 400 K/uL   nRBC 0.0 0.0 - 0.2 %   Neutrophils Relative % 80 %   Neutro Abs 9.5 (H) 1.7 - 7.7 K/uL   Lymphocytes Relative 7 %   Lymphs Abs 0.8 0.7 - 4.0 K/uL   Monocytes Relative 11 %   Monocytes Absolute 1.3 (H) 0.1 - 1.0 K/uL   Eosinophils Relative 0 %   Eosinophils Absolute 0.0 0.0 - 0.5 K/uL   Basophils Relative 0 %   Basophils Absolute 0.0 0.0 - 0.1 K/uL   Immature Granulocytes 2 %   Abs Immature Granulocytes 0.18 (H) 0.00 - 0.07 K/uL  Comprehensive metabolic panel     Status: Abnormal   Collection Time: 03/26/20 11:13 AM  Result Value Ref Range   Sodium 132 (L) 135 - 145 mmol/L   Potassium 3.8 3.5 - 5.1 mmol/L   Chloride 102 98 - 111 mmol/L   CO2 18 (L) 22 - 32 mmol/L   Glucose, Bld 88 70 - 99 mg/dL   BUN <5 (L) 6 - 20 mg/dL   Creatinine, Ser 0.47 0.44 - 1.00 mg/dL   Calcium 8.7 (L) 8.9 - 10.3 mg/dL   Total Protein 5.8 (L) 6.5 - 8.1 g/dL   Albumin 2.7 (L) 3.5 - 5.0 g/dL   AST 19 15 - 41 U/L   ALT 14 0 - 44 U/L   Alkaline Phosphatase 75 38 - 126 U/L   Total Bilirubin 0.4 0.3 - 1.2 mg/dL   GFR, Estimated >60 >60 mL/min   Anion gap 12 5 - 15    Imaging:  No results found.  MAU Course: Orders Placed This Encounter  Procedures  . Resp Panel by RT-PCR (Flu A&B, Covid) Nasopharyngeal Swab  . Urinalysis, Routine w reflex microscopic Urine, Clean Catch  . CBC with Differential/Platelet   . Comprehensive metabolic panel  . Encourage fluids  . Airborne and Contact precautions  . Discharge patient  Meds ordered this encounter  Medications  . acetaminophen (TYLENOL) tablet 1,000 mg  . cyclobenzaprine (FLEXERIL) tablet 10 mg  . cyclobenzaprine (FLEXERIL) 10 MG tablet    Sig: Take 1 tablet (10 mg total) by mouth 3 (three) times daily as needed for muscle spasms.    Dispense:  30 tablet    Refill:  1    Order Specific Question:   Supervising Provider    Answer:   Reva Bores [[id number]]  . acetaminophen (TYLENOL) 325 MG tablet    Sig: Take 2 tablets (650 mg total) by mouth every 6 (six) hours as needed.    Dispense:  60 tablet    Refill:  0    Order Specific Question:   Supervising Provider    Answer:   Samara Snide    MDM: Pt given Tylenol and flexeril for body aches, headache and fever. Encouraged to hydrate orally since no complaints of nausea.  When reassessed, pt verbalized relief of headache and most body aches. Informed of Covid results and encouraged to rest, hydrate, take vitamins C, D, and zinc and let her body recover. Additional instructions for symptom management included in AVS  Encouraged FOB to get tested for Covid, work notes given to both patient and FOB.  Assessment: 1. COVID-19 affecting pregnancy in third trimester   2. NST (non-stress test) reactive    Plan: Discharge home in stable condition with third trimester and Covid return precautions    Follow-up Information    [facility], [place]. Go to.   Specialty: Obstetrics and Gynecology Why: as scheduled for ongoing prenatal care  Contact information: [address] [place] [address] [phone]               Allergies as of 03/26/2020   No Known Allergies     Medication List    TAKE these medications   acetaminophen 325 MG tablet Commonly known as: TYLENOL Take 2 tablets (650 mg total) by mouth every 6 (six) hours as needed.    cyclobenzaprine 10 MG tablet Commonly known as: FLEXERIL Take 1 tablet (10 mg total) by mouth 3 (three) times daily as needed for muscle spasms.   diphenhydrAMINE 25 MG tablet Commonly known as: BENADRYL Take 25 mg by mouth every 6 (six) hours as needed.   metroNIDAZOLE 500 MG tablet Commonly known as: FLAGYL Take 1 tablet (500 mg total) by mouth 2 (two) times daily.   vitamin C 500 MG tablet Commonly known as: ASCORBIC ACID Take 500 mg by mouth daily.      Edd Arbour, MSN, CNM, [facility]

## 2020-05-07 LAB — OB RESULTS CONSOLE GBS: GBS: NEGATIVE

## 2020-05-28 ENCOUNTER — Encounter (HOSPITAL_COMMUNITY): Payer: Self-pay | Admitting: Obstetrics and Gynecology

## 2020-05-28 ENCOUNTER — Inpatient Hospital Stay (HOSPITAL_COMMUNITY)
Admission: AD | Admit: 2020-05-28 | Discharge: 2020-05-30 | DRG: 807 | Disposition: A | Discharge status: Home / Self Care | Payer: Medicaid Other | Attending: Obstetrics and Gynecology | Admitting: Obstetrics and Gynecology

## 2020-05-28 ENCOUNTER — Inpatient Hospital Stay (EMERGENCY_DEPARTMENT_HOSPITAL)
Admission: AD | Admit: 2020-05-28 | Discharge: 2020-05-28 | Disposition: A | Discharge status: Home / Self Care | Payer: Medicaid Other | Source: Home / Self Care | Attending: Obstetrics and Gynecology | Admitting: Obstetrics and Gynecology

## 2020-05-28 DIAGNOSIS — Z3A39 39 weeks gestation of pregnancy: Secondary | ICD-10-CM | POA: Insufficient documentation

## 2020-05-28 DIAGNOSIS — D509 Iron deficiency anemia, unspecified: Secondary | ICD-10-CM | POA: Diagnosis present

## 2020-05-28 DIAGNOSIS — O479 False labor, unspecified: Secondary | ICD-10-CM

## 2020-05-28 DIAGNOSIS — O471 False labor at or after 37 completed weeks of gestation: Secondary | ICD-10-CM | POA: Diagnosis not present

## 2020-05-28 DIAGNOSIS — O9902 Anemia complicating childbirth: Secondary | ICD-10-CM | POA: Diagnosis present

## 2020-05-28 DIAGNOSIS — O26893 Other specified pregnancy related conditions, third trimester: Secondary | ICD-10-CM | POA: Diagnosis present

## 2020-05-28 MED ORDER — PRENATAL MULTIVITAMIN CH
1.0000 | ORAL_TABLET | Freq: Every day | ORAL | Status: DC
  Administered 2020-05-28 – 2020-05-30 (×3): 1 via ORAL
  Filled 2020-05-28 (×3): qty 1

## 2020-05-28 MED ORDER — COCONUT OIL OIL: 1.0000 "application " | TOPICAL_OIL | Status: DC | PRN

## 2020-05-28 MED ORDER — OXYTOCIN BOLUS FROM INFUSION: 333.0000 mL | Freq: Once | INTRAVENOUS | Status: DC

## 2020-05-28 MED ORDER — ONDANSETRON HCL 4 MG/2ML IJ SOLN: 4.0000 mg | Freq: Four times a day (QID) | INTRAMUSCULAR | Status: DC | PRN

## 2020-05-28 MED ORDER — ZOLPIDEM TARTRATE 5 MG PO TABS
5.0000 mg | ORAL_TABLET | Freq: Every evening | ORAL | 0 refills | Status: DC | PRN
Start: 2020-05-28 — End: 2020-05-29

## 2020-05-28 MED ORDER — DIPHENHYDRAMINE HCL 25 MG PO CAPS: 25.0000 mg | ORAL_CAPSULE | Freq: Four times a day (QID) | ORAL | Status: DC | PRN

## 2020-05-28 MED ORDER — ZOLPIDEM TARTRATE 5 MG PO TABS
5.0000 mg | ORAL_TABLET | Freq: Every evening | ORAL | Status: DC | PRN
Start: 2020-05-28 — End: 2020-05-30

## 2020-05-28 MED ORDER — IBUPROFEN 600 MG PO TABS
600.0000 mg | ORAL_TABLET | Freq: Four times a day (QID) | ORAL | Status: DC
  Administered 2020-05-28 – 2020-05-30 (×8): 600 mg via ORAL
  Filled 2020-05-28 (×9): qty 1

## 2020-05-28 MED ORDER — ZOLPIDEM TARTRATE 5 MG PO TABS
5.0000 mg | ORAL_TABLET | Freq: Every evening | ORAL | 0 refills | Status: DC | PRN
Start: 2020-05-28 — End: 2020-05-28

## 2020-05-28 MED ORDER — LIDOCAINE HCL (PF) 1 % IJ SOLN
30.0000 mL | INTRAMUSCULAR | Status: AC | PRN
  Administered 2020-05-28: 30 mL via SUBCUTANEOUS
  Filled 2020-05-28: qty 30

## 2020-05-28 MED ORDER — SOD CITRATE-CITRIC ACID 500-334 MG/5ML PO SOLN: 30.0000 mL | ORAL | Status: DC | PRN

## 2020-05-28 MED ORDER — ACETAMINOPHEN 325 MG PO TABS: 650.0000 mg | ORAL_TABLET | ORAL | Status: DC | PRN

## 2020-05-28 MED ORDER — OXYCODONE-ACETAMINOPHEN 5-325 MG PO TABS: 2.0000 | ORAL_TABLET | ORAL | Status: DC | PRN

## 2020-05-28 MED ORDER — BENZOCAINE-MENTHOL 20-0.5 % EX AERO
1.0000 "application " | INHALATION_SPRAY | CUTANEOUS | Status: DC | PRN
  Administered 2020-05-28: 1 via TOPICAL
  Filled 2020-05-28 (×2): qty 56

## 2020-05-28 MED ORDER — OXYCODONE HCL 5 MG PO TABS: 10.0000 mg | ORAL_TABLET | ORAL | Status: DC | PRN

## 2020-05-28 MED ORDER — DIBUCAINE (PERIANAL) 1 % EX OINT: 1.0000 "application " | TOPICAL_OINTMENT | CUTANEOUS | Status: DC | PRN

## 2020-05-28 MED ORDER — OXYTOCIN 10 UNIT/ML IJ SOLN
INTRAMUSCULAR | Status: AC
  Administered 2020-05-28: 10 [IU]
  Filled 2020-05-28: qty 1

## 2020-05-28 MED ORDER — SIMETHICONE 80 MG PO CHEW: 80.0000 mg | CHEWABLE_TABLET | ORAL | Status: DC | PRN

## 2020-05-28 MED ORDER — LACTATED RINGERS IV SOLN: INTRAVENOUS | Status: DC

## 2020-05-28 MED ORDER — ONDANSETRON HCL 4 MG/2ML IJ SOLN: 4.0000 mg | INTRAMUSCULAR | Status: DC | PRN

## 2020-05-28 MED ORDER — MISOPROSTOL 200 MCG PO TABS
ORAL_TABLET | ORAL | Status: AC
  Administered 2020-05-28: 1000 ug via RECTAL
  Filled 2020-05-28: qty 5

## 2020-05-28 MED ORDER — WITCH HAZEL-GLYCERIN EX PADS: 1.0000 "application " | MEDICATED_PAD | CUTANEOUS | Status: DC | PRN

## 2020-05-28 MED ORDER — ONDANSETRON HCL 4 MG PO TABS: 4.0000 mg | ORAL_TABLET | ORAL | Status: DC | PRN

## 2020-05-28 MED ORDER — LACTATED RINGERS IV SOLN: 500.0000 mL | INTRAVENOUS | Status: DC | PRN

## 2020-05-28 MED ORDER — OXYTOCIN-SODIUM CHLORIDE 30-0.9 UT/500ML-% IV SOLN: 2.5000 [IU]/h | INTRAVENOUS | Status: DC

## 2020-05-28 MED ORDER — SENNOSIDES-DOCUSATE SODIUM 8.6-50 MG PO TABS
2.0000 | ORAL_TABLET | Freq: Every day | ORAL | Status: DC
  Administered 2020-05-29 – 2020-05-30 (×2): 2 via ORAL
  Filled 2020-05-28 (×2): qty 2

## 2020-05-28 MED ORDER — MISOPROSTOL 200 MCG PO TABS: 1000.0000 ug | ORAL_TABLET | Freq: Once | ORAL | Status: AC

## 2020-05-28 MED ORDER — OXYCODONE HCL 5 MG PO TABS: 5.0000 mg | ORAL_TABLET | ORAL | Status: DC | PRN

## 2020-05-28 MED ORDER — OXYCODONE-ACETAMINOPHEN 5-325 MG PO TABS: 1.0000 | ORAL_TABLET | ORAL | Status: DC | PRN

## 2020-05-28 MED ORDER — FLEET ENEMA 7-19 GM/118ML RE ENEM: 1.0000 | ENEMA | RECTAL | Status: DC | PRN

## 2020-05-28 MED ORDER — TETANUS-DIPHTH-ACELL PERTUSSIS 5-2.5-18.5 LF-MCG/0.5 IM SUSY: 0.5000 mL | PREFILLED_SYRINGE | Freq: Once | INTRAMUSCULAR | Status: DC

## 2020-05-28 NOTE — Discharge Instructions (Signed)
First Stage of Labor Labor is your body's natural process of moving your baby and other structures, including the placenta and umbilical cord, out of your uterus. There are three stages of labor. How long each stage lasts is different for every woman. But certain events happen during each stage that are the same for everyone.  The first stage starts when true labor begins. This stage ends when your cervix, which is the opening from your uterus into your vagina, is completely open (dilated).  The second stage begins when your cervix is fully dilated and you start pushing. This stage ends when your baby is born.  The third stage is the delivery of the organ that nourished your baby during pregnancy (placenta). First stage of labor As your due date gets closer, you may start to notice certain physical changes that mean labor is going to start soon. You may feel that your baby has dropped lower into your pelvis. You may experience irregular, often painless, contractions that go away when you walk around or lie down (Braxton Hicks contractions). This is also called false labor. The first stage of labor begins when you start having contractions that come at regular (evenly spaced) intervals and your cervix starts to get thinner and wider in preparation for your baby to pass through. Birth care providers measure the dilation of your cervix in centimeters (cm). One centimeter is a little less than one-half of an inch. The first stage ends when your cervix is dilated to 10 cm. The first stage of labor is divided into three phases:  Early phase.  Active phase.  Transitional phase. The length of the first stage of labor varies. It may be longer if this is your first pregnancy. You may spend most of this stage at home trying to relax and stay comfortable. How does this affect me? During the first stage of labor, you will move through three phases. What happens in the early phase?  You will start to have  regular contractions that last 30-60 seconds. Contractions may come every 5-20 minutes. Keep track of your contractions and call your birth care provider.  Your water may break during this phase.  You may notice a clear or slightly bloody discharge of mucus (mucus plug) from your vagina.  Your cervix will dilate to 3-6 cm. What happens in the active phase? The active phase usually lasts 3-5 hours. You may go to the hospital or birth center around this time. During the active phase:  Your contractions will become stronger, longer, and more uncomfortable.  Your contractions may last 45-90 seconds and come every 3-5 minutes.  You may feel lower back pain.  Your birth care providers may examine your cervix and feel your belly to find the position of your baby.  You may have a monitor strapped to your belly to measure your contractions and your baby's heart rate.  You may start using your pain management options.  Your cervix may be dilated to 6 cm and may start to dilate more quickly. What happens in the transitional phase? The transitional phase typically lasts from 30 minutes to 2 hours. At the end of this phase, your cervix will be fully dilated to 10 cm. During the transitional phase:  Contractions will get stronger and longer.  Contractions may last 60-90 seconds and come less than 2 minutes apart.  You may feel hot flashes, chills, or nausea. How does this affect my baby? During the first stage of labor, your baby will   gradually move down into your birth canal. Follow these instructions at home and in the hospital or birth center:  When labor first begins, try to stay calm. You are still in the early phase. If it is night, try to get some sleep. If it is day, try to relax and save your energy. You may want to make some calls and get ready to go to the hospital or birth center.  When you are in the early phase, try these methods to help ease discomfort: ? Deep breathing and  muscle relaxation. ? Taking a walk. ? Taking a warm bath or shower.  Drink some fluids and have a light snack if you feel like it.  Keep track of your contractions.  Based on the plan you created with your birth care provider, call when your contractions indicate it is time.  If your water breaks, note the time, color, and odor of the fluid.  When you are in the active phase, do your breathing exercises and rely on your support people and your team of birth care providers.   Contact a health care provider if:  Your contractions are strong and regular.  You have lower back pain or cramping.  Your water breaks.  You lose your mucus plug. Get help right away if you:  Have a severe headache that does not go away.  Have changes in your vision.  Have severe pain in your upper belly.  Do not feel the baby move.  Have bright red bleeding. Summary  The first stage of labor starts when true labor begins, and it ends when your cervix is dilated to 10 cm.  The first stage of labor has three phases: early, active, and transitional.  Your baby moves into the birth canal during the first stage of labor.  You may have contractions that become stronger and longer. You may also lose your mucus plug and have your water break.  Call your birth care provider when your contractions are frequent and strong enough to go to the hospital or birth center. This information is not intended to replace advice given to you by your health care provider. Make sure you discuss any questions you have with your health care provider. Document Revised: 06/27/2018 Document Reviewed: 05/20/2017 Elsevier Patient Education  2021 [employer]  [employer] of the uterus can occur throughout pregnancy, but they are not always a sign that you are in labor. You may have practice contractions called Braxton Hicks contractions. These false labor contractions are sometimes confused with  true labor. What are Deberah Pelton contractions? Braxton Hicks contractions are tightening movements that occur in the muscles of the uterus before labor. Unlike true labor contractions, these contractions do not result in opening (dilation) and thinning of the cervix. Toward the end of pregnancy (32-34 weeks), Braxton Hicks contractions can happen more often and may become stronger. These contractions are sometimes difficult to tell apart from true labor because they can be very uncomfortable. You should not feel embarrassed if you go to the hospital with false labor. Sometimes, the only way to tell if you are in true labor is for your health care provider to look for changes in the cervix. The health care provider will do a physical exam and may monitor your contractions. If you are not in true labor, the exam should show that your cervix is not dilating and your water has not broken. If there are no other health problems associated with your pregnancy,  it is completely safe for you to be sent home with false labor. You may continue to have Braxton Hicks contractions until you go into true labor. How to tell the difference between true labor and false labor True labor  Contractions last 30-70 seconds.  Contractions become very regular.  Discomfort is usually felt in the top of the uterus, and it spreads to the lower abdomen and low back.  Contractions do not go away with walking.  Contractions usually become more intense and increase in frequency.  The cervix dilates and gets thinner. False labor  Contractions are usually shorter and not as strong as true labor contractions.  Contractions are usually irregular.  Contractions are often felt in the front of the lower abdomen and in the groin.  Contractions may go away when you walk around or change positions while lying down.  Contractions get weaker and are shorter-lasting as time goes on.  The cervix usually does not dilate or become  thin. Follow these instructions at home:  Take over-the-counter and prescription medicines only as told by your health care provider.  Keep up with your usual exercises and follow other instructions from your health care provider.  Eat and drink lightly if you think you are going into labor.  If Braxton Hicks contractions are making you uncomfortable: ? Change your position from lying down or resting to walking, or change from walking to resting. ? Sit and rest in a tub of warm water. ? Drink enough fluid to keep your urine pale yellow. Dehydration may cause these contractions. ? Do slow and deep breathing several times an hour.  Keep all follow-up prenatal visits as told by your health care provider. This is important.   Contact a health care provider if:  You have a fever.  You have continuous pain in your abdomen. Get help right away if:  Your contractions become stronger, more regular, and closer together.  You have fluid leaking or gushing from your vagina.  You pass blood-tinged mucus (bloody show).  You have bleeding from your vagina.  You have low back pain that you never had before.  You feel your baby's head pushing down and causing pelvic pressure.  Your baby is not moving inside you as much as it used to. Summary  Contractions that occur before labor are called Braxton Hicks contractions, false labor, or practice contractions.  Braxton Hicks contractions are usually shorter, weaker, farther apart, and less regular than true labor contractions. True labor contractions usually become progressively stronger and regular, and they become more frequent.  Manage discomfort from [facility] contractions by changing position, resting in a warm bath, drinking plenty of water, or practicing deep breathing. This information is not intended to replace advice given to you by your health care provider. Make sure you discuss any questions you have with your health care  provider. Document Revised: 02/16/2017 Document Reviewed: 07/20/2016 Elsevier Patient Education  2021 [employer].

## 2020-05-28 NOTE — MAU Note (Signed)
At approximately 1105 MAU registration called and stated patient is in lobby and needed to push. Tyler Aas, CNM at bedside to check patient. Patient determined 10cm with BBOW. FHR 132 in MAU.

## 2020-05-28 NOTE — MAU Provider Note (Signed)
Event Date/Time  First Provider Initiated Contact with Patient 05/28/20 0541     S: Ms. Sabrina Hahn is a 25 y.o. G2P0010 at [redacted]w[redacted]d  who presents to MAU today complaining contractions q two minutes. She denies vaginal bleeding. She denies LOF. She reports normal fetal movement.  She receives care with [facility]  O: BP 118/67   Pulse (!) 104   LMP 08/24/2019   SpO2 100%  GENERAL: Well-developed, well-nourished female in no acute distress.  HEAD: Normocephalic, atraumatic.  CHEST: Normal effort of breathing, regular heart rate ABDOMEN: Soft, nontender, gravid  Cervical exam:  Dilation: 3 Effacement (%): 80 Cervical Position: Posterior Station: -1 Presentation: Vertex Exam by:: weston,rn   Fetal Monitoring: Baseline: 145 Variability: Mod Accelerations: 15 x 15 Decelerations: None Contractions: Irregular q 2-7 min   A: SIUP at [redacted]w[redacted]d  Cat I tracing Negligible cervical change after 90 minutes of observation Patient declined offer of additional hour of monitoring, requests pain medicine  Meds ordered this encounter  Medications  . zolpidem (AMBIEN) 5 MG tablet    Sig: Take 1 tablet (5 mg total) by mouth at bedtime as needed for up to 4 days for sleep.    Dispense:  4 tablet    Refill:  0    Order Specific Question:   Supervising Provider    Answer:   Mariel Aloe A [[id number]]    P: Discharge home in stable condition  Clayton Bibles, CNM 05/28/2020 7:06 AM

## 2020-05-28 NOTE — H&P (Signed)
OB ADMISSION/ HISTORY & PHYSICAL:  Admission Date: 05/28/2020 10:58 AM  Admit Diagnosis: Active labor  Sabrina Hahn is a 25 y.o. female G84P1011 [redacted]w[redacted]d presenting for active labor. Endorses active FM, denies LOF and vaginal bleeding. Ctx began on 05/28/20 @ 0300. Received call from MAU that she was 8cm with a BBOW. Pt to L&D. On arrival pt found to be C/C/+2. Discussed AROM R/B/A with pt. Pt verbalized ok to proceed with AROM. AROM complete with clear fluid noted. CAT 2. Imminent delivery.  History of current pregnancy: G2P1011   Primary OB Provider: [facility] Patient entered late to  care with [facility] at 17.5 wks.   EDC 05/30/20 by US   Anatomy scan:  21.1 wks, complete w/ anterior placenta.   Last evaluation: 39.3  wks  Significant prenatal events: Anemia of pregnancy Patient Active Problem List   Diagnosis Date Noted  . Normal labor 05/28/2020  . Women's annual routine gynecological examination 01/02/2019  . Missed period 01/02/2019    Prenatal Labs: ABO, Rh:  O Positive Antibody:  Negative Rubella: Immune (10/04 0000)   RPR:   NR HBsAg: Negative (10/04 0000)   HIV: Non-reactive (10/04 0000)   GTT: 111 GBS: Negative/-- (02/18 0000)  GC/CHL: negative/negative Genetics: Quad screen negative Tdap/influenza vaccines: UTD   OB History  Gravida Para Term Preterm AB Living  2 1 1   1 1   SAB IAB Ectopic Multiple Live Births      1 0 1    # Outcome Date GA Lbr Len/2nd Weight Sex Delivery Anes PTL Lv  2 Term 05/28/20 [redacted]w[redacted]d 02:06 / 00:19 3549 g F Vag-Spont Local  LIV  1 Ectopic             Medical / Surgical History: Past medical history:  Past Medical History:  Diagnosis Date  . Ectopic pregnancy   . Medical history non-contributory   . Missed period 01/02/2019  . PID (acute pelvic inflammatory disease)   . Women's annual routine gynecological examination 01/02/2019    Past surgical history:  Past Surgical History:  Procedure Laterality Date  . NO PAST SURGERIES      Family History:  Family History  Problem Relation Age of Onset  . Cancer Other   . Hypertension Mother   . Hypertension Father     Social History:  reports that she has never smoked. She has never used smokeless tobacco. She reports that she does not drink alcohol and does not use drugs.  Allergies: Patient has no known allergies.   Current Medications at time of admission:  Prior to Admission medications   Medication Sig Start Date End Date Taking? Authorizing Provider  acetaminophen (TYLENOL) 325 MG tablet Take 2 tablets (650 mg total) by mouth every 6 (six) hours as needed. 03/26/20   05/24/20, CNM  cyclobenzaprine (FLEXERIL) 10 MG tablet Take 1 tablet (10 mg total) by mouth 3 (three) times daily as needed for muscle spasms. 03/26/20   05/24/20, CNM  diphenhydrAMINE (BENADRYL) 25 MG tablet Take 25 mg by mouth every 6 (six) hours as needed.    [provider]  vitamin C (ASCORBIC ACID) 500 MG tablet Take 500 mg by mouth daily.    [provider]  zolpidem (AMBIEN) 5 MG tablet Take 1 tablet (5 mg total) by mouth at bedtime as needed for up to 4 days for sleep. 05/28/20 06/01/20  06/03/20, CNM    Review of Systems: Constitutional: Negative   HENT:  Negative   Eyes: Negative   Respiratory: Negative   Cardiovascular: Negative   Gastrointestinal: Negative  Genitourinary: negative for bloody show, negative for LOF   Musculoskeletal: Negative   Skin: Negative   Neurological: Negative   Endo/Heme/Allergies: Negative   Psychiatric/Behavioral: Negative    Physical Exam: VS: Blood pressure 134/86, pulse (!) 102, temperature 98.7 F (37.1 C), temperature source Oral, resp. rate 16, last menstrual period 08/24/2019, unknown if currently breastfeeding. AAO x3, no signs of distress Cardiovascular: RRR Respiratory: Lung fields clear to ausculation GU/GI: Abdomen gravid, non-tender, non-distended, active FM, vertex Extremities: negative edema,  negative for pain, tenderness, and cords  Cervical exam:Dilation: 10 FHR: baseline rate 150 / variability minimal / accelerations absent / early decelerations TOCO: 2-4   Prenatal Transfer Tool  Maternal Diabetes: No Genetic Screening: Normal Maternal Ultrasounds/Referrals: Normal Fetal Ultrasounds or other Referrals:  None Maternal Substance Abuse:  No Significant Maternal Medications:  None Significant Maternal Lab Results: Group B Strep negative    Assessment: 25 y.o. [id number] [redacted]w[redacted]d admitted for active labor  active stage of labor FHR category 2 GBS negative Pain management plan: natural   Plan:  Admit to L&D Routine admission orders  Dr Su Hilt notified of admission and plan of care  Carollee Leitz MSN, CNM 05/28/2020 12:35 PM

## 2020-05-28 NOTE — MAU Note (Signed)
Pt presents to MAU c/o CTX coming approx every 2 min.  Pt denies LOF, vaginal bleeding at this time.  Pt endorses +FM.

## 2020-05-29 LAB — CBC
HCT: 25.4 % — ABNORMAL LOW (ref 36.0–46.0)
Hemoglobin: 8.4 g/dL — ABNORMAL LOW (ref 12.0–15.0)
MCH: 27 pg (ref 26.0–34.0)
MCHC: 33.1 g/dL (ref 30.0–36.0)
MCV: 81.7 fL (ref 80.0–100.0)
Platelets: 290 10*3/uL (ref 150–400)
RBC: 3.11 MIL/uL — ABNORMAL LOW (ref 3.87–5.11)
RDW: 14.9 % (ref 11.5–15.5)
WBC: 19.9 10*3/uL — ABNORMAL HIGH (ref 4.0–10.5)
nRBC: 0 % (ref 0.0–0.2)

## 2020-05-29 LAB — RPR: RPR Ser Ql: NONREACTIVE

## 2020-05-29 MED ORDER — POLYSACCHARIDE IRON COMPLEX 150 MG PO CAPS
150.0000 mg | ORAL_CAPSULE | Freq: Every day | ORAL | Status: DC
  Administered 2020-05-29 – 2020-05-30 (×2): 150 mg via ORAL
  Filled 2020-05-29 (×2): qty 1

## 2020-05-29 MED ORDER — MAGNESIUM OXIDE 400 (241.3 MG) MG PO TABS
400.0000 mg | ORAL_TABLET | Freq: Every day | ORAL | Status: DC
  Administered 2020-05-29 – 2020-05-30 (×2): 400 mg via ORAL
  Filled 2020-05-29 (×2): qty 1

## 2020-05-29 NOTE — Plan of Care (Signed)
  Problem: Education: Goal: Knowledge of condition will improve Outcome: Adequate for Discharge   Problem: Activity: Goal: Will verbalize the importance of balancing activity with adequate rest periods Outcome: Adequate for Discharge Goal: Ability to tolerate increased activity will improve Outcome: Adequate for Discharge   Problem: Coping: Goal: Ability to identify and utilize available resources and services will improve Outcome: Adequate for Discharge   Problem: Life Cycle: Goal: Chance of risk for complications during the postpartum period will decrease Outcome: Adequate for Discharge   Problem: Role Relationship: Goal: Ability to demonstrate positive interaction with newborn will improve Outcome: Adequate for Discharge

## 2020-05-29 NOTE — Plan of Care (Signed)
  Problem: Education: Goal: Knowledge of condition will improve Outcome: Completed/Met   Problem: Activity: Goal: Will verbalize the importance of balancing activity with adequate rest periods Outcome: Completed/Met Goal: Ability to tolerate increased activity will improve Outcome: Completed/Met   Problem: Life Cycle: Goal: Chance of risk for complications during the postpartum period will decrease Outcome: Completed/Met   Problem: Role Relationship: Goal: Ability to demonstrate positive interaction with newborn will improve Outcome: Completed/Met   Problem: Skin Integrity: Goal: Demonstration of wound healing without infection will improve Outcome: Completed/Met   

## 2020-05-29 NOTE — Progress Notes (Signed)
PPD# 1 SVD w/ 2nd degree laceration Information for the patient's newborn:  Eleanna, Theilen [[id number]]  female    Baby Name Rylie Circumcision N/A   S:   Reports feeling good Tolerating PO fluid and solids No nausea or vomiting Bleeding is scant, no clots Pain controlled with PO meds Up ad lib / ambulatory / voiding w/o difficulty Feeding: Bottle    O:   VS: BP 117/71   Pulse (!) 103   Temp 99 F (37.2 C) (Oral)   Resp 18   LMP 08/24/2019   SpO2 100%   Breastfeeding Unknown   LABS:  Recent Labs    05/29/20 0540  WBC 19.9*  HGB 8.4*  PLT 290   Blood type:   Rubella: Immune (10/04 0000)                      I&O: Intake/Output      03/11 0701 03/12 0700 03/12 0701 03/13 0700   Urine 200    Blood 288    Total Output 488    Net -488           Physical Exam: Alert and oriented X3 Lungs: Clear and unlabored Heart: regular rate and rhythm / no mumurs Abdomen: soft, non-tender, non-distended  Fundus: firm, non-tender Perineum: well-approximated, healing, non-edematous Lochia: appropriate Extremities: no edema, no calf pain or tenderness    A:  PPD # 1  Normal exam IDA      -start oral iron  P:  Routine post partum orders Anticipate D/C on 05/30/20  Plan reviewed w/ Dr. Evorn Gong, MSN, CNM 05/29/2020, 12:33 PM

## 2020-05-30 MED ORDER — CYCLOBENZAPRINE HCL 10 MG PO TABS: 10.0000 mg | ORAL_TABLET | Freq: Three times a day (TID) | ORAL | 0 refills | Status: DC

## 2020-05-30 MED ORDER — POLYSACCHARIDE IRON COMPLEX 150 MG PO CAPS: 150.0000 mg | ORAL_CAPSULE | Freq: Every day | ORAL | 0 refills | Status: DC

## 2020-05-30 MED ORDER — ACETAMINOPHEN 325 MG PO TABS: 650.0000 mg | ORAL_TABLET | ORAL | 0 refills | Status: DC | PRN

## 2020-05-30 MED ORDER — CYCLOBENZAPRINE HCL 10 MG PO TABS: 10.0000 mg | ORAL_TABLET | Freq: Three times a day (TID) | ORAL | Status: DC

## 2020-05-30 MED ORDER — IBUPROFEN 600 MG PO TABS: 600.0000 mg | ORAL_TABLET | Freq: Four times a day (QID) | ORAL | 0 refills | Status: DC

## 2020-05-30 MED ORDER — MAGNESIUM OXIDE 400 (241.3 MG) MG PO TABS: 400.0000 mg | ORAL_TABLET | Freq: Every day | ORAL | Status: DC

## 2020-05-30 NOTE — Discharge Summary (Signed)
SVD with 2 degree laceration OB Discharge Summary  Patient Name: Sabrina Hahn DOB: 1996/02/18 MRN: [id number]  Date of admission: 05/28/2020 Intrauterine pregnancy: [redacted]w[redacted]d   Admitting diagnosis: Normal labor [O80, Z37.9] Secondary diagnosis: None  Date of discharge: 05/30/2020    Discharge diagnosis: Term Pregnancy Delivered     Prenatal history: G2P1011   EDC : 05/30/2020, by Last Menstrual Period  [facility] at [facility]  Primary provider : [facility]  Prenatal Labs: ABO, Rh:   O positive Antibody:  Negative Rubella: Immune (10/04 0000)   RPR: NON REACTIVE (03/12 0540)  HBsAg: Negative (10/04 0000)  HIV: Non-reactive (10/04 0000)  GBS: Negative/-- (02/18 0000)                                    Hospital course:  Onset of Labor With Vaginal Delivery      25 y.o. yo G2P1011 at [redacted]w[redacted]d was admitted in Active Labor on 05/28/2020. Patient had an uncomplicated labor course as follows:  Membrane Rupture Time/Date: 11:13 AM ,05/28/2020   Delivery Method:Vaginal, Spontaneous  Episiotomy: None  Lacerations:  2nd degree;Perineal  Patient had an uncomplicated postpartum course.  She is ambulating, tolerating a regular diet, passing flatus, and urinating well. Patient is discharged home in stable condition on 05/30/20.  Newborn Data: Birth date:05/28/2020  Birth time:11:25 AM  Gender:Female  Living status:Living  Apgars:8 ,9  Weight:3549 g  Delivering PROVIDER: Johney Frame B                                                            Complications: None  Newborn Data: Live born female  Birth Weight: 7 lb 13.2 oz (3549 g) APGAR: 8, 9  Newborn Delivery   Birth date/time: 05/28/2020 11:25:00 Delivery type: Vaginal, Spontaneous      Baby Feeding: Bottle Disposition:home with mother  Post partum procedures:N/A  Labs: Lab Results  Component Value Date   WBC 19.9 (H) 05/29/2020   HGB 8.4 (L) 05/29/2020   HCT 25.4 (L) 05/29/2020   MCV 81.7 05/29/2020   PLT 290  05/29/2020   CMP Latest Ref Rng & Units 03/26/2020  Glucose 70 - 99 mg/dL 88  BUN 6 - 20 mg/dL <6(C)  Creatinine 3.76 - 1.00 mg/dL 2.83  Sodium 151 - 761 mmol/L 132(L)  Potassium 3.5 - 5.1 mmol/L 3.8  Chloride 98 - 111 mmol/L 102  CO2 22 - 32 mmol/L 18(L)  Calcium 8.9 - 10.3 mg/dL 6.0(V)  Total Protein 6.5 - 8.1 g/dL 3.7(T)  Total Bilirubin 0.3 - 1.2 mg/dL 0.4  Alkaline Phos 38 - 126 U/L 75  AST 15 - 41 U/L 19  ALT 0 - 44 U/L 14    Physical Exam @ time of discharge:  Vitals:   05/29/20 1433 05/29/20 2055 05/30/20 0521 05/30/20 0527  BP: 105/63 107/68  118/80  Pulse: 93 93  98  Resp: 16 15  18   Temp: 98.3 F (36.8 C) 98.3 F (36.8 C) 98.3 F (36.8 C) 98.3 F (36.8 C)  TempSrc: Oral  Oral Oral  SpO2: 100% 100%     general: alert, cooperative and no distress lochia: appropriate uterine fundus: firm perineum: 2 degree well approximated incision: N/A  extremities: DVT Evaluation: No evidence of DVT seen on physical exam. Negative Homan's sign. No cords or calf tenderness. No significant calf/ankle edema.  Discharge instructions:  "Baby and Me Booklet" Discharge Medications:  Allergies as of 05/30/2020   No Known Allergies     Medication List    STOP taking these medications   Emergen-C Immune Plus Pack     TAKE these medications   acetaminophen 325 MG tablet Commonly known as: Tylenol Take 2 tablets (650 mg total) by mouth every 4 (four) hours as needed (for pain scale < 4).   cyclobenzaprine 10 MG tablet Commonly known as: FLEXERIL Take 1 tablet (10 mg total) by mouth 3 (three) times daily.   ibuprofen 600 MG tablet Commonly known as: ADVIL Take 1 tablet (600 mg total) by mouth every 6 (six) hours.   iron polysaccharides 150 MG capsule Commonly known as: NIFEREX Take 1 capsule (150 mg total) by mouth daily. Start taking on: May 31, 2020   magnesium oxide 400 (241.3 Mg) MG tablet Commonly known as: MAG-OX Take 1 tablet (400 mg total) by mouth  daily. Start taking on: May 31, 2020   prenatal multivitamin Tabs tablet Take 1 tablet by mouth daily at 12 noon.      Diet: routine diet Activity: Advance as tolerated. Pelvic rest x 6 weeks.  Follow up:4 weeks  Signed: Carollee Leitz MSN, CNM 05/30/2020, 11:24 AM

## 2020-08-18 LAB — CYTOLOGY - PAP: Pap: NEGATIVE

## 2020-08-23 LAB — CYTOLOGY - PAP: Pap: NEGATIVE

## 2021-03-20 NOTE — L&D Delivery Note (Addendum)
Delivery Note ?Progressed quickly after AROm at ~930. I was called to the room for urge to push. Patient was coping with labor without medications and push 2-3 times for delivery. Infant infant was kept attached to the cord for > 1 minute until the cord flattened and then cord was cut by father.  ? ?At 10:22 AM a viable female was delivered via Vaginal, Spontaneous (Presentation: Right Occiput Anterior).  APGAR: 9, 9; weight - pending .   ?Placenta status: Spontaneous, Intact.  Cord: 3 vessels with the following complications: None.  Cord pH: collected ? ?Anesthesia: Local- infiltrated 30 cc of 1% lidocaine  ?Episiotomy: None ?Lacerations: 2nd degree, repaired in typical fashion ?Suture Repair: 3.0 vicryl ?Est. Blood Loss (mL):  100-- plan had been to administer TXA but the uterus was very firm and the patient had minimal bleeding and was receiving pitocin bolus. No clots were expressed. Will have a low threshold to treat bleeding but do not feel the clinical evidence at this time warrants treatment. This was discussed with the patient and family and they agreed with plan.  ? ?Mom to postpartum.  Baby to Couplet care / Skin to Skin. ? ?Federico Flake ?06/26/2021, 11:12 AM ? ? ? ?

## 2021-04-26 ENCOUNTER — Other Ambulatory Visit: Payer: Self-pay

## 2021-04-26 ENCOUNTER — Ambulatory Visit (INDEPENDENT_AMBULATORY_CARE_PROVIDER_SITE_OTHER): Payer: Medicaid Other

## 2021-04-26 VITALS — BP 120/71 | HR 83 | Wt 155.7 lb

## 2021-04-26 DIAGNOSIS — Z3201 Encounter for pregnancy test, result positive: Secondary | ICD-10-CM

## 2021-04-26 LAB — POCT PREGNANCY, URINE: Preg Test, Ur: POSITIVE — AB

## 2021-04-26 NOTE — Progress Notes (Signed)
Patient here today for urine pregnancy test. Pregnancy test positive. Patient denies vaginal bleeding or pain. Patient does state she feels some pelvic pressure. Per patient her LMP was 09/30/20 making her [redacted]w[redacted]d with an EDD of 07/07/21. Patient states she wants to receive prenatal care here at [facility]. New OB appointment scheduled. Recommended patient begin taking prenatal vitamins. Patient denies any other concerns or questions.   Alesia Richards, RN 04/26/21

## 2021-04-27 ENCOUNTER — Telehealth (INDEPENDENT_AMBULATORY_CARE_PROVIDER_SITE_OTHER): Payer: Medicaid Other

## 2021-04-27 DIAGNOSIS — O0990 Supervision of high risk pregnancy, unspecified, unspecified trimester: Secondary | ICD-10-CM | POA: Insufficient documentation

## 2021-04-27 DIAGNOSIS — Z3480 Encounter for supervision of other normal pregnancy, unspecified trimester: Secondary | ICD-10-CM

## 2021-04-27 DIAGNOSIS — Z3A00 Weeks of gestation of pregnancy not specified: Secondary | ICD-10-CM

## 2021-04-27 MED ORDER — GOJJI WEIGHT SCALE MISC: 1.0000 | Status: DC

## 2021-04-27 MED ORDER — BLOOD PRESSURE MONITORING DEVI: 1.0000 | 0 refills | Status: DC

## 2021-04-27 NOTE — Progress Notes (Signed)
New OB Intake  I connected with  Sabrina Hahn on 04/27/21 at  3:15 PM EST by MyChart Video Visit and verified that I am speaking with the correct person using two identifiers. Nurse is located at [facility] and pt is located at work.  I discussed the limitations, risks, security and privacy concerns of performing an evaluation and management service by telephone and the availability of in person appointments. I also discussed with the patient that there may be a patient responsible charge related to this service. The patient expressed understanding and agreed to proceed.  I explained I am completing New OB Intake today. We discussed her EDD of 07/07/21 that is based on LMP of 09/30/20. Pt is G3/P1. I reviewed her allergies, medications, Medical/Surgical/OB history, and appropriate screenings. I informed her of [facility] services. Based on history, this is a/an  pregnancy uncomplicated .   Patient Active Problem List   Diagnosis Date Noted   Normal labor 05/28/2020   Women's annual routine gynecological examination 01/02/2019   Missed period 01/02/2019    Concerns addressed today  Delivery Plans:  Plans to deliver at [facility].   MyChart/Babyscripts MyChart access verified. I explained pt will have some visits in office and some virtually. Babyscripts instructions given and order placed. Patient verifies receipt of registration text/e-mail. Account successfully created and app downloaded.  Blood Pressure Cuff  Blood pressure cuff ordered for patient to pick-up from [employer]. Explained after first prenatal appt pt will check weekly and document in Babyscripts.  Weight scale: Patient does / does not  have weight scale. Weight scale ordered for patient to pick up from [employer].   Anatomy US Explained first scheduled [place] will be around 19 weeks. Anatomy US scheduled for 05/12/21 at 7:30a. Pt notified to arrive at 7:00a. Scheduled AFP lab only appointment if CenteringPregnancy pt for  same day as anatomy US.   Labs Discussed Avelina Laine genetic screening with patient. Would like both Panorama and Horizon drawn at new OB visit.Also if interested in genetic testing, tell patient she will need AFP 15-21 weeks to complete genetic testing .Routine prenatal labs needed.  Covid Vaccine Patient has covid vaccine.   CenteringPregnancy Candidate?  If yes, offer as possibility  Mother/ Baby Dyad Candidate?    If yes, offer as possibility  Informed patient of [facility] website  and placed link in her AVS.   Social Determinants of Health Food Insecurity: Patient denies food insecurity. WIC Referral: Patient is interested in referral to [facility].  Transportation: Patient denies transportation needs. Childcare: Discussed no children allowed at ultrasound appointments. Offered childcare services; patient declines childcare services at this time.  Send link to Pregnancy Navigators   Placed OB Box on problem list and updated  First visit review I reviewed new OB appt with pt. I explained she will have a pelvic exam, ob bloodwork with genetic screening, and PAP smear. Explained pt will be seen by Nolene Bernheim, NP at first visit; encounter routed to appropriate provider. Explained that patient will be seen by pregnancy navigator following visit with provider. [facility] information placed in AVS.   Henrietta Dine, CMA 04/27/2021  3:28 PM

## 2021-04-27 NOTE — Patient Instructions (Signed)
  At our [facility], we work as an integrated team, providing care to address both physical and emotional health. Your medical provider may refer you to see our Behavioral Health Clinician (BHC) on the same day you see your medical provider, as availability permits; often scheduled virtually at your convenience.  Our BHC is available to all patients, visits generally last between 20-30 minutes, but can be longer or shorter, depending on patient need. The BHC offers help with stress management, coping with symptoms of depression and anxiety, major life changes , sleep issues, changing risky behavior, grief and loss, life stress, working on personal life goals, and  behavioral health issues, as these all affect your overall health and wellness.  The BHC is NOT available for the following: FMLA paperwork, court-ordered evaluations, specialty assessments (custody or disability), letters to employers, or obtaining certification for an emotional support animal. The BHC does not provide long-term therapy. You have the right to refuse integrated behavioral health services, or to reschedule to see the BHC at a later date.  Confidentiality exception: If it is suspected that a child or disabled adult is being abused or neglected, we are required by law to report that to either Child Protective Services or Adult Protective Services.  If you have a diagnosis of Bipolar affective disorder, Schizophrenia, or recurrent Major depressive disorder, we will recommend that you establish care with a psychiatrist, as these are lifelong, chronic conditions, and we want your overall emotional health and medications to be more closely monitored. If you anticipate needing extended maternity leave due to mental health issues postpartum, it it recommended you inform your medical provider, so we can put in a referral to a psychiatrist as soon as possible. The BHC is unable to recommend an extended maternity leave for mental  health issues. Your medical provider or BHC may refer you to a therapist for ongoing, traditional therapy, or to a psychiatrist, for medication management, if it would benefit your overall health. Depending on your insurance, you may have a copay or be charged a deductible, depending on your insurance, to see the BHC. If you are uninsured, it is recommended that you apply for financial assistance. (Forms may be requested at the front desk for in-person visits, via MyChart, or request a form during a virtual visit).  If you see the BHC more than 6 times, you will have to complete a comprehensive clinical assessment interview with the BHC to resume integrated services.  For virtual visits with the BHC, you must be physically in the state of [place] at the time of the visit. For example, if you live in [place], you will have to do an in-person visit with the BHC, and your out-of-state insurance may not cover behavioral health services in [place]. If you are going out of the state or country for any reason, the BHC may see you virtually when you return to [place], but not while you are physically outside of [place].    

## 2021-05-12 ENCOUNTER — Other Ambulatory Visit: Payer: Self-pay | Admitting: Nurse Practitioner

## 2021-05-12 ENCOUNTER — Other Ambulatory Visit: Payer: Self-pay | Admitting: *Deleted

## 2021-05-12 ENCOUNTER — Ambulatory Visit: Payer: Medicaid Other | Admitting: *Deleted

## 2021-05-12 ENCOUNTER — Other Ambulatory Visit: Payer: Self-pay

## 2021-05-12 ENCOUNTER — Encounter: Payer: Self-pay | Admitting: Nurse Practitioner

## 2021-05-12 ENCOUNTER — Ambulatory Visit (HOSPITAL_BASED_OUTPATIENT_CLINIC_OR_DEPARTMENT_OTHER): Payer: Medicaid Other | Admitting: Maternal & Fetal Medicine

## 2021-05-12 ENCOUNTER — Ambulatory Visit (HOSPITAL_BASED_OUTPATIENT_CLINIC_OR_DEPARTMENT_OTHER): Payer: Medicaid Other | Admitting: *Deleted

## 2021-05-12 ENCOUNTER — Ambulatory Visit: Payer: Medicaid Other | Attending: Nurse Practitioner

## 2021-05-12 ENCOUNTER — Encounter: Payer: Self-pay | Admitting: *Deleted

## 2021-05-12 VITALS — BP 110/66 | HR 84

## 2021-05-12 DIAGNOSIS — Z3480 Encounter for supervision of other normal pregnancy, unspecified trimester: Secondary | ICD-10-CM

## 2021-05-12 DIAGNOSIS — Z363 Encounter for antenatal screening for malformations: Secondary | ICD-10-CM | POA: Diagnosis not present

## 2021-05-12 DIAGNOSIS — Z3689 Encounter for other specified antenatal screening: Secondary | ICD-10-CM

## 2021-05-12 DIAGNOSIS — O09893 Supervision of other high risk pregnancies, third trimester: Secondary | ICD-10-CM

## 2021-05-12 DIAGNOSIS — Z3A32 32 weeks gestation of pregnancy: Secondary | ICD-10-CM | POA: Diagnosis not present

## 2021-05-12 DIAGNOSIS — O99213 Obesity complicating pregnancy, third trimester: Secondary | ICD-10-CM

## 2021-05-12 DIAGNOSIS — O0933 Supervision of pregnancy with insufficient antenatal care, third trimester: Secondary | ICD-10-CM | POA: Insufficient documentation

## 2021-05-12 DIAGNOSIS — O365931 Maternal care for other known or suspected poor fetal growth, third trimester, fetus 1: Secondary | ICD-10-CM

## 2021-05-12 DIAGNOSIS — O365930 Maternal care for other known or suspected poor fetal growth, third trimester, not applicable or unspecified: Secondary | ICD-10-CM | POA: Diagnosis present

## 2021-05-12 NOTE — Procedures (Signed)
Sabrina Hahn 1996/02/21 [redacted]w[redacted]d  Fetus A Non-Stress Test Interpretation for 05/12/21  Indication: IUGR  Fetal Heart Rate A Mode: External Baseline Rate (A): 140 bpm Variability: Moderate Accelerations: 10 x 10 Decelerations: Variable Multiple birth?: No  Uterine Activity Mode: Toco Contraction Frequency (min): 4-8 Contraction Duration (sec): 50-90 Contraction Quality: Mild (pt only felt one u/c throughout her time on the monitor; said  it felt "tight") Resting Tone Palpated: Relaxed  Interpretation (Fetal Testing) Nonstress Test Interpretation: Non-reactive Overall Impression: Reassuring for gestational age Comments: tracing reviewed by Dr. Gertie Exon

## 2021-05-12 NOTE — Progress Notes (Signed)
MFM Consultation  Ms. Sabrina Hahn is 26 yo G3P1 at 32 weeks by LMP with an EDD of 07/07/21. She is seen at the request of Nolene Bernheim, NP.  Single intrauterine pregnancy here for a follow up growth due to late prenatal care. Normal anatomy with measurements less than dates due to IUGR EFW 6th%.  There is good fetal movement and amniotic fluid volume The UA Dopplers are normal without evidence of AEDF or REDF  I discussed today's visit with a diagnosis of IUGR. I explained that the etiology includes placental insufficiency, chronic disease, infection, aneuploidy and other genetic syndromes. She is scheduled to obtain genetic screening on 2/28. I explained that no markers of aneuploidy were observed.  She has no additional risk factors for chronic disease. At this time I explained the diagnosis, evaluation and management to include serial fetal growth and weekly antenatal testing to include UA Dopplers.  Delivery is dependent on EFW if < 3% we recommend delivery at 37 weeks if > 5-10% 38-39 weeks.  Secondly we observe possible finding of a duplicated renal collecting system as we suspect to renal pelvies there was no evidence of renal pyelectasis which can be seen commonly.   We will reassess these finding at the next ultrasound.   I spent 30 minutes with > 50% in face to face consultation.    Novella Olive, MD

## 2021-05-16 ENCOUNTER — Other Ambulatory Visit: Payer: Self-pay

## 2021-05-16 ENCOUNTER — Other Ambulatory Visit (HOSPITAL_COMMUNITY)
Admission: RE | Admit: 2021-05-16 | Discharge: 2021-05-16 | Disposition: A | Discharge status: Home / Self Care | Payer: Medicaid Other | Source: Ambulatory Visit | Attending: Nurse Practitioner | Admitting: Nurse Practitioner

## 2021-05-16 ENCOUNTER — Ambulatory Visit (INDEPENDENT_AMBULATORY_CARE_PROVIDER_SITE_OTHER): Payer: Medicaid Other | Admitting: Nurse Practitioner

## 2021-05-16 ENCOUNTER — Encounter: Payer: Self-pay | Admitting: Nurse Practitioner

## 2021-05-16 ENCOUNTER — Encounter: Payer: Self-pay | Admitting: Family Medicine

## 2021-05-16 VITALS — BP 117/66 | HR 93 | Wt 155.6 lb

## 2021-05-16 DIAGNOSIS — Z23 Encounter for immunization: Secondary | ICD-10-CM

## 2021-05-16 DIAGNOSIS — O0933 Supervision of pregnancy with insufficient antenatal care, third trimester: Secondary | ICD-10-CM | POA: Diagnosis not present

## 2021-05-16 DIAGNOSIS — Z3480 Encounter for supervision of other normal pregnancy, unspecified trimester: Secondary | ICD-10-CM | POA: Diagnosis not present

## 2021-05-16 DIAGNOSIS — Z5941 Food insecurity: Secondary | ICD-10-CM

## 2021-05-16 DIAGNOSIS — O365930 Maternal care for other known or suspected poor fetal growth, third trimester, not applicable or unspecified: Secondary | ICD-10-CM

## 2021-05-16 DIAGNOSIS — O0990 Supervision of high risk pregnancy, unspecified, unspecified trimester: Secondary | ICD-10-CM | POA: Diagnosis not present

## 2021-05-16 DIAGNOSIS — Z3A32 32 weeks gestation of pregnancy: Secondary | ICD-10-CM

## 2021-05-16 LAB — POCT URINALYSIS DIP (DEVICE)
Bilirubin Urine: NEGATIVE
Glucose, UA: NEGATIVE mg/dL
Hgb urine dipstick: NEGATIVE
Ketones, ur: NEGATIVE mg/dL
Nitrite: NEGATIVE
Protein, ur: NEGATIVE mg/dL
Specific Gravity, Urine: 1.02 (ref 1.005–1.030)
Urobilinogen, UA: 0.2 mg/dL (ref 0.0–1.0)
pH: 7 (ref 5.0–8.0)

## 2021-05-16 NOTE — Progress Notes (Signed)
Subjective:   Sabrina Hahn is a 26 y.o. G3P1011 at [redacted]w[redacted]d by LMP being seen today for her first obstetrical visit.  Her obstetrical history is significant for  late to prenatal care, IUGR at 6%, short interval betweek pregnancies . Patient does not intend to breast feed. Pregnancy history fully reviewed.  Patient reports occasional contractions.  HISTORY: OB History  Gravida Para Term Preterm AB Living  3 1 1  0 1 1  SAB IAB Ectopic Multiple Live Births  0 0 1 0 1    # Outcome Date GA Lbr Len/2nd Weight Sex Delivery Anes PTL Lv  3 Current           2 Term 05/28/20 [redacted]w[redacted]d 02:06 / 00:19 7 lb 13.2 oz (3.549 kg) F Vag-Spont Local  LIV     Name: Thrush,GIRL Shaneice     Apgar1: 8  Apgar5: 9  1 Ectopic            Past Medical History:  Diagnosis Date   Anemia    Ectopic pregnancy    Medical history non-contributory    Missed period 01/02/2019   PID (acute pelvic inflammatory disease)    Women's annual routine gynecological examination 01/02/2019   Past Surgical History:  Procedure Laterality Date   NO PAST SURGERIES     Family History  Problem Relation Age of Onset   Cancer Other    Hypertension Mother    Hypertension Father    Social History   Tobacco Use   Smoking status: Never   Smokeless tobacco: Never  Vaping Use   Vaping Use: Never used  Substance Use Topics   Alcohol use: No   Drug use: No   No Known Allergies Current Outpatient Medications on File Prior to Visit  Medication Sig Dispense Refill   acetaminophen (TYLENOL) 325 MG tablet Take 2 tablets (650 mg total) by mouth every 4 (four) hours as needed (for pain scale < 4). 30 tablet 0   Prenatal Vit-Fe Fumarate-FA (PRENATAL MULTIVITAMIN) TABS tablet Take 1 tablet by mouth daily at 12 noon.     Blood Pressure Monitoring DEVI 1 each by Does not apply route once a week. (Patient not taking: Reported on 05/16/2021) 1 each 0   cyclobenzaprine (FLEXERIL) 10 MG tablet Take 1 tablet (10 mg total) by mouth 3  (three) times daily. (Patient not taking: Reported on 04/26/2021) 15 tablet 0   ibuprofen (ADVIL) 600 MG tablet Take 1 tablet (600 mg total) by mouth every 6 (six) hours. (Patient not taking: Reported on 04/26/2021) 30 tablet 0   iron polysaccharides (NIFEREX) 150 MG capsule Take 1 capsule (150 mg total) by mouth daily. (Patient not taking: Reported on 04/26/2021) 30 capsule 0   magnesium oxide (MAG-OX) 400 (241.3 Mg) MG tablet Take 1 tablet (400 mg total) by mouth daily. (Patient not taking: Reported on 04/26/2021)     Misc. Devices (GOJJI WEIGHT SCALE) MISC 1 each by Does not apply route once a week. (Patient not taking: Reported on 05/16/2021) 1 each O   No current facility-administered medications on file prior to visit.     Exam   Vitals:   05/16/21 1341  BP: 117/66  Pulse: 93  Weight: 155 lb 9.6 oz (70.6 kg)   Fetal Heart Rate (bpm): 135  Uterus:  Fundal Height: 34 cm  Pelvic Exam: Perineum: deferred   Vulva:    Vagina:     Cervix:    Adnexa:    Bony Pelvis:  System: General: well-developed, well-nourished female in no acute distress   Breast:  deferred   Skin: normal coloration and turgor, no rashes   Neurologic: oriented, normal, negative, normal mood   Extremities: normal strength, tone, and muscle mass, ROM of all joints is normal   HEENT extraocular movement intact and sclera clear, anicteric   Mouth/Teeth deferred   Neck supple and no masses, normal thyroid   Cardiovascular: regular rate and rhythm, soft systolic murmur heard   Respiratory:  no respiratory distress, normal breath sounds   Abdomen: soft, non-tender; no masses,  no organomegaly     Assessment:   Pregnancy: G3P1011 Patient Active Problem List   Diagnosis Date Noted   Late prenatal care affecting pregnancy in third trimester 05/12/2021   IUGR (intrauterine growth restriction) affecting care of mother, third trimester, fetus 1 05/12/2021   Supervision of high risk pregnancy, antepartum 04/27/2021      Plan:  1. Supervision of high risk pregnancy, antepartum Is standing almost continually at work - note to have frequent breaks to sit given. Will come on 05-19-21 for 2 hour glucola  - Flu Vaccine QUAD 71mo+IM (Fluarix, Fluzone & Alfiuria Quad PF) - Tdap vaccine greater than or equal to 7yo IM  2. Supervision of other normal pregnancy, antepartum  - [facility] AMB BABYSCRIPTS SCHEDULE OPTIMIZATION - Culture, OB Urine - CBC/D/Plt+RPR+Rh+ABO+RubIgG... - Genetic Screening - Hemoglobin A1c - GC/Chlamydia probe amp ([place])not at [facility]  3. Food insecurity Went to the food market today - AMBULATORY REFERRAL TO [place]  4. Insufficient prenatal care in third trimester and poor fetal growth - 6th % Late to care - beginning at 32 weeks Ultrasounds with BPP and doppler studies managed by MFM  Initial labs drawn. Continue prenatal vitamins. Genetic Screening discussed, NIPS: ordered. Ultrasound discussed; fetal anatomic survey:  has [place] appointments scheduled . Problem list reviewed and updated. The nature of [place] with multiple MDs and other Advanced Practice Providers was explained to patient; also emphasized that residents, students are part of our team. Routine obstetric precautions reviewed. Return in about 2 weeks (around 05/30/2021) for ROB - schedule with MD for BTL consult.  Total face-to-face time with patient: 40 minutes.  Over 50% of encounter was spent on counseling and coordination of care.     Earlie Server, FNP Family Nurse Practitioner, [facility] for [employer] [employer] 05/16/2021 2:28 PM

## 2021-05-17 ENCOUNTER — Encounter: Payer: Self-pay | Admitting: Nurse Practitioner

## 2021-05-17 DIAGNOSIS — D509 Iron deficiency anemia, unspecified: Secondary | ICD-10-CM | POA: Insufficient documentation

## 2021-05-17 DIAGNOSIS — O99019 Anemia complicating pregnancy, unspecified trimester: Secondary | ICD-10-CM | POA: Insufficient documentation

## 2021-05-17 LAB — CBC/D/PLT+RPR+RH+ABO+RUBIGG...
Antibody Screen: NEGATIVE
Basophils Absolute: 0.1 10*3/uL (ref 0.0–0.2)
Basos: 0 %
EOS (ABSOLUTE): 0.1 10*3/uL (ref 0.0–0.4)
Eos: 1 %
HCV Ab: NONREACTIVE
HIV Screen 4th Generation wRfx: NONREACTIVE
Hematocrit: 29.8 % — ABNORMAL LOW (ref 34.0–46.6)
Hemoglobin: 9.8 g/dL — ABNORMAL LOW (ref 11.1–15.9)
Hepatitis B Surface Ag: NEGATIVE
Immature Grans (Abs): 0.1 10*3/uL (ref 0.0–0.1)
Immature Granulocytes: 1 %
Lymphocytes Absolute: 1.9 10*3/uL (ref 0.7–3.1)
Lymphs: 16 %
MCH: 28.2 pg (ref 26.6–33.0)
MCHC: 32.9 g/dL (ref 31.5–35.7)
MCV: 86 fL (ref 79–97)
Monocytes Absolute: 0.8 10*3/uL (ref 0.1–0.9)
Monocytes: 7 %
Neutrophils Absolute: 9.3 10*3/uL — ABNORMAL HIGH (ref 1.4–7.0)
Neutrophils: 75 %
Platelets: 318 10*3/uL (ref 150–450)
RBC: 3.47 x10E6/uL — ABNORMAL LOW (ref 3.77–5.28)
RDW: 14.2 % (ref 11.7–15.4)
RPR Ser Ql: NONREACTIVE
Rh Factor: POSITIVE
Rubella Antibodies, IGG: 4.55 index (ref >0.99)
WBC: 12.3 10*3/uL — ABNORMAL HIGH (ref 3.4–10.8)

## 2021-05-17 LAB — GC/CHLAMYDIA PROBE AMP (~~LOC~~) NOT AT ARMC
Chlamydia: NEGATIVE
Comment: NEGATIVE
Comment: NORMAL
Neisseria Gonorrhea: NEGATIVE

## 2021-05-17 LAB — HEMOGLOBIN A1C
Est. average glucose Bld gHb Est-mCnc: 108 mg/dL
Hgb A1c MFr Bld: 5.4 % (ref 4.8–5.6)

## 2021-05-17 LAB — HCV INTERPRETATION

## 2021-05-17 MED ORDER — POLYSACCHARIDE IRON COMPLEX 150 MG PO CAPS: 150.0000 mg | ORAL_CAPSULE | Freq: Every day | ORAL | 2 refills | Status: DC

## 2021-05-17 NOTE — Addendum Note (Signed)
Addended by: Vikki Ports on: 05/17/2021 02:35 PM   Modules accepted: Level of Service

## 2021-05-17 NOTE — Addendum Note (Signed)
Addended by: Currie Paris on: 05/17/2021 08:43 AM   Modules accepted: Orders

## 2021-05-18 LAB — CULTURE, OB URINE

## 2021-05-18 LAB — URINE CULTURE, OB REFLEX

## 2021-05-19 ENCOUNTER — Ambulatory Visit: Payer: Medicaid Other | Admitting: *Deleted

## 2021-05-19 ENCOUNTER — Other Ambulatory Visit: Payer: Self-pay | Admitting: General Practice

## 2021-05-19 ENCOUNTER — Other Ambulatory Visit: Payer: Self-pay

## 2021-05-19 ENCOUNTER — Ambulatory Visit: Payer: Medicaid Other | Attending: Maternal & Fetal Medicine

## 2021-05-19 ENCOUNTER — Other Ambulatory Visit: Payer: Medicaid Other

## 2021-05-19 VITALS — BP 116/58 | HR 96

## 2021-05-19 DIAGNOSIS — O403XX0 Polyhydramnios, third trimester, not applicable or unspecified: Secondary | ICD-10-CM | POA: Diagnosis not present

## 2021-05-19 DIAGNOSIS — O99213 Obesity complicating pregnancy, third trimester: Secondary | ICD-10-CM | POA: Insufficient documentation

## 2021-05-19 DIAGNOSIS — Z3A33 33 weeks gestation of pregnancy: Secondary | ICD-10-CM

## 2021-05-19 DIAGNOSIS — O0990 Supervision of high risk pregnancy, unspecified, unspecified trimester: Secondary | ICD-10-CM

## 2021-05-19 DIAGNOSIS — O09293 Supervision of pregnancy with other poor reproductive or obstetric history, third trimester: Secondary | ICD-10-CM

## 2021-05-19 DIAGNOSIS — O09893 Supervision of other high risk pregnancies, third trimester: Secondary | ICD-10-CM | POA: Diagnosis present

## 2021-05-19 DIAGNOSIS — O365931 Maternal care for other known or suspected poor fetal growth, third trimester, fetus 1: Secondary | ICD-10-CM | POA: Insufficient documentation

## 2021-05-19 DIAGNOSIS — O0933 Supervision of pregnancy with insufficient antenatal care, third trimester: Secondary | ICD-10-CM | POA: Insufficient documentation

## 2021-05-20 LAB — GLUCOSE TOLERANCE, 2 HOURS W/ 1HR
Glucose, 1 hour: 95 mg/dL (ref 70–179)
Glucose, 2 hour: 110 mg/dL (ref 70–152)
Glucose, Fasting: 81 mg/dL (ref 70–91)

## 2021-05-26 ENCOUNTER — Ambulatory Visit: Payer: Medicaid Other | Admitting: *Deleted

## 2021-05-26 ENCOUNTER — Ambulatory Visit: Payer: Medicaid Other | Attending: Maternal & Fetal Medicine

## 2021-05-26 ENCOUNTER — Other Ambulatory Visit: Payer: Self-pay

## 2021-05-26 VITALS — BP 114/59 | HR 91

## 2021-05-26 DIAGNOSIS — O0990 Supervision of high risk pregnancy, unspecified, unspecified trimester: Secondary | ICD-10-CM

## 2021-05-26 DIAGNOSIS — O35EXX0 Maternal care for other (suspected) fetal abnormality and damage, fetal genitourinary anomalies, not applicable or unspecified: Secondary | ICD-10-CM | POA: Diagnosis not present

## 2021-05-26 DIAGNOSIS — O365931 Maternal care for other known or suspected poor fetal growth, third trimester, fetus 1: Secondary | ICD-10-CM

## 2021-05-26 DIAGNOSIS — O0933 Supervision of pregnancy with insufficient antenatal care, third trimester: Secondary | ICD-10-CM | POA: Diagnosis not present

## 2021-05-26 DIAGNOSIS — Z3A34 34 weeks gestation of pregnancy: Secondary | ICD-10-CM

## 2021-05-26 DIAGNOSIS — O09893 Supervision of other high risk pregnancies, third trimester: Secondary | ICD-10-CM | POA: Diagnosis present

## 2021-05-26 DIAGNOSIS — O365930 Maternal care for other known or suspected poor fetal growth, third trimester, not applicable or unspecified: Secondary | ICD-10-CM | POA: Diagnosis not present

## 2021-05-26 DIAGNOSIS — O09293 Supervision of pregnancy with other poor reproductive or obstetric history, third trimester: Secondary | ICD-10-CM

## 2021-05-26 DIAGNOSIS — O99213 Obesity complicating pregnancy, third trimester: Secondary | ICD-10-CM | POA: Insufficient documentation

## 2021-05-30 ENCOUNTER — Ambulatory Visit (INDEPENDENT_AMBULATORY_CARE_PROVIDER_SITE_OTHER): Payer: Medicaid Other | Admitting: Obstetrics & Gynecology

## 2021-05-30 ENCOUNTER — Other Ambulatory Visit: Payer: Self-pay

## 2021-05-30 ENCOUNTER — Encounter: Payer: Self-pay | Admitting: Family Medicine

## 2021-05-30 VITALS — BP 110/67 | HR 102 | Wt 158.5 lb

## 2021-05-30 DIAGNOSIS — O365931 Maternal care for other known or suspected poor fetal growth, third trimester, fetus 1: Secondary | ICD-10-CM

## 2021-05-30 DIAGNOSIS — O0990 Supervision of high risk pregnancy, unspecified, unspecified trimester: Secondary | ICD-10-CM

## 2021-05-30 DIAGNOSIS — O0933 Supervision of pregnancy with insufficient antenatal care, third trimester: Secondary | ICD-10-CM

## 2021-05-30 NOTE — Progress Notes (Signed)
? ?  PRENATAL VISIT NOTE ? ?Subjective:  ?Sabrina Hahn is a 26 y.o. G3P1011 at [redacted]w[redacted]d being seen today for ongoing prenatal care.  She is currently monitored for the following issues for this high-risk pregnancy and has Supervision of high risk pregnancy, antepartum; Late prenatal care affecting pregnancy in third trimester; IUGR (intrauterine growth restriction) affecting care of mother, third trimester, fetus 1; Food insecurity; and Anemia in pregnancy on their problem list. ? ?Patient reports no complaints.  Contractions: Irritability. Vag. Bleeding: None.  Movement: Present. Denies leaking of fluid.  ? ?The following portions of the patient's history were reviewed and updated as appropriate: allergies, current medications, past family history, past medical history, past social history, past surgical history and problem list.  ? ?Objective:  ? ?Vitals:  ? 05/30/21 0928  ?BP: 110/67  ?Pulse: (!) 102  ?Weight: 158 lb 8 oz (71.9 kg)  ? ? ?Fetal Status: Fetal Heart Rate (bpm): 142   Movement: Present    ? ?General:  Alert, oriented and cooperative. Patient is in no acute distress.  ?Skin: Skin is warm and dry. No rash noted.   ?Cardiovascular: Normal heart rate noted  ?Respiratory: Normal respiratory effort, no problems with respiration noted  ?Abdomen: Soft, gravid, appropriate for gestational age.  Pain/Pressure: Present     ?Pelvic: Cervical exam deferred        ?Extremities: Normal range of motion.  Edema: None  ?Mental Status: Normal mood and affect. Normal behavior. Normal judgment and thought content.  ? ?Assessment and Plan:  ?Pregnancy: G3P1011 at [redacted]w[redacted]d ?1. Supervision of high risk pregnancy, antepartum ?Weekly fetal surveillance ? ?2. IUGR (intrauterine growth restriction) affecting care of mother, third trimester, fetus 1 ?Growth [place] this week ? ?3. Late prenatal care affecting pregnancy in third trimester ? ? ?Preterm labor symptoms and general obstetric precautions including but not limited to vaginal  bleeding, contractions, leaking of fluid and fetal movement were reviewed in detail with the patient. ?Please refer to After Visit Summary for other counseling recommendations.  ? ?Return in about 2 weeks (around 06/13/2021). ? ?Future Appointments  ?Date Time Provider [place]  ?06/02/2021 10:30 AM [facility] NURSE [facility]  ?06/02/2021 10:45 AM [facility] US6 [facility]  ?06/09/2021  2:30 PM [facility] NURSE [facility]  ?06/09/2021  2:45 PM [facility] US5 [facility]  ? ? ?Emeterio Reeve, MD ?

## 2021-05-31 ENCOUNTER — Encounter: Payer: Self-pay | Admitting: Obstetrics & Gynecology

## 2021-06-02 ENCOUNTER — Other Ambulatory Visit: Payer: Self-pay | Admitting: *Deleted

## 2021-06-02 ENCOUNTER — Other Ambulatory Visit: Payer: Self-pay

## 2021-06-02 ENCOUNTER — Ambulatory Visit: Payer: Medicaid Other | Attending: Maternal & Fetal Medicine

## 2021-06-02 ENCOUNTER — Ambulatory Visit: Payer: Medicaid Other | Admitting: *Deleted

## 2021-06-02 ENCOUNTER — Encounter: Payer: Self-pay | Admitting: *Deleted

## 2021-06-02 VITALS — BP 111/56 | HR 89

## 2021-06-02 DIAGNOSIS — Z3A35 35 weeks gestation of pregnancy: Secondary | ICD-10-CM

## 2021-06-02 DIAGNOSIS — O0990 Supervision of high risk pregnancy, unspecified, unspecified trimester: Secondary | ICD-10-CM | POA: Insufficient documentation

## 2021-06-02 DIAGNOSIS — O99213 Obesity complicating pregnancy, third trimester: Secondary | ICD-10-CM | POA: Diagnosis not present

## 2021-06-02 DIAGNOSIS — O0933 Supervision of pregnancy with insufficient antenatal care, third trimester: Secondary | ICD-10-CM | POA: Diagnosis not present

## 2021-06-02 DIAGNOSIS — O365931 Maternal care for other known or suspected poor fetal growth, third trimester, fetus 1: Secondary | ICD-10-CM | POA: Insufficient documentation

## 2021-06-02 DIAGNOSIS — O365930 Maternal care for other known or suspected poor fetal growth, third trimester, not applicable or unspecified: Secondary | ICD-10-CM | POA: Diagnosis not present

## 2021-06-02 DIAGNOSIS — O09893 Supervision of other high risk pregnancies, third trimester: Secondary | ICD-10-CM

## 2021-06-03 ENCOUNTER — Encounter: Payer: Self-pay | Admitting: *Deleted

## 2021-06-09 ENCOUNTER — Ambulatory Visit: Payer: Medicaid Other | Admitting: *Deleted

## 2021-06-09 ENCOUNTER — Ambulatory Visit: Payer: Medicaid Other | Attending: Maternal & Fetal Medicine

## 2021-06-09 ENCOUNTER — Other Ambulatory Visit: Payer: Self-pay

## 2021-06-09 VITALS — BP 110/66 | HR 96

## 2021-06-09 DIAGNOSIS — O99213 Obesity complicating pregnancy, third trimester: Secondary | ICD-10-CM | POA: Diagnosis not present

## 2021-06-09 DIAGNOSIS — O09293 Supervision of pregnancy with other poor reproductive or obstetric history, third trimester: Secondary | ICD-10-CM | POA: Diagnosis not present

## 2021-06-09 DIAGNOSIS — O365930 Maternal care for other known or suspected poor fetal growth, third trimester, not applicable or unspecified: Secondary | ICD-10-CM

## 2021-06-09 DIAGNOSIS — O0990 Supervision of high risk pregnancy, unspecified, unspecified trimester: Secondary | ICD-10-CM | POA: Insufficient documentation

## 2021-06-09 DIAGNOSIS — O09893 Supervision of other high risk pregnancies, third trimester: Secondary | ICD-10-CM | POA: Insufficient documentation

## 2021-06-09 DIAGNOSIS — O0933 Supervision of pregnancy with insufficient antenatal care, third trimester: Secondary | ICD-10-CM | POA: Diagnosis not present

## 2021-06-09 DIAGNOSIS — O365931 Maternal care for other known or suspected poor fetal growth, third trimester, fetus 1: Secondary | ICD-10-CM | POA: Diagnosis present

## 2021-06-09 DIAGNOSIS — E669 Obesity, unspecified: Secondary | ICD-10-CM

## 2021-06-09 DIAGNOSIS — Z3A36 36 weeks gestation of pregnancy: Secondary | ICD-10-CM

## 2021-06-13 ENCOUNTER — Other Ambulatory Visit: Payer: Self-pay

## 2021-06-13 ENCOUNTER — Other Ambulatory Visit (HOSPITAL_COMMUNITY)
Admission: RE | Admit: 2021-06-13 | Discharge: 2021-06-13 | Disposition: A | Discharge status: Home / Self Care | Payer: Medicaid Other | Source: Ambulatory Visit | Attending: Obstetrics and Gynecology | Admitting: Obstetrics and Gynecology

## 2021-06-13 ENCOUNTER — Ambulatory Visit (INDEPENDENT_AMBULATORY_CARE_PROVIDER_SITE_OTHER): Payer: Medicaid Other | Admitting: Obstetrics and Gynecology

## 2021-06-13 VITALS — BP 110/64 | HR 88 | Wt 157.0 lb

## 2021-06-13 DIAGNOSIS — O365931 Maternal care for other known or suspected poor fetal growth, third trimester, fetus 1: Secondary | ICD-10-CM

## 2021-06-13 DIAGNOSIS — O0990 Supervision of high risk pregnancy, unspecified, unspecified trimester: Secondary | ICD-10-CM | POA: Diagnosis not present

## 2021-06-13 DIAGNOSIS — O09293 Supervision of pregnancy with other poor reproductive or obstetric history, third trimester: Secondary | ICD-10-CM | POA: Insufficient documentation

## 2021-06-13 DIAGNOSIS — Z3A36 36 weeks gestation of pregnancy: Secondary | ICD-10-CM

## 2021-06-13 NOTE — Progress Notes (Signed)
? ?  PRENATAL VISIT NOTE ? ?Subjective:  ?Sabrina Hahn is a 26 y.o. G3P1011 at [redacted]w[redacted]d being seen today for ongoing prenatal care.  She is currently monitored for the following issues for this high-risk pregnancy and has Supervision of high risk pregnancy, antepartum; Late prenatal care affecting pregnancy in third trimester; IUGR (intrauterine growth restriction) affecting care of mother, third trimester, fetus 1; Food insecurity; Anemia in pregnancy; and History of postpartum hemorrhage, currently pregnant in third trimester on their problem list. ? ?Patient doing well with no acute concerns today. She reports occasional contractions.  Contractions: Irritability. Vag. Bleeding: None.  Movement: Present. Denies leaking of fluid.  ? ?Pt ambivalent about IOL.  Explained that the IOL is necessary due to the IUGR and the possible decreased performance of her placenta. ? ?The following portions of the patient's history were reviewed and updated as appropriate: allergies, current medications, past family history, past medical history, past social history, past surgical history and problem list. Problem list updated. ? ?Objective:  ? ?Vitals:  ? 06/13/21 1038  ?BP: 110/64  ?Pulse: 88  ?Weight: 157 lb (71.2 kg)  ? ? ?Fetal Status: Fetal Heart Rate (bpm): 138 Fundal Height: 35 cm Movement: Present    ? ?General:  Alert, oriented and cooperative. Patient is in no acute distress.  ?Skin: Skin is warm and dry. No rash noted.   ?Cardiovascular: Normal heart rate noted  ?Respiratory: Normal respiratory effort, no problems with respiration noted  ?Abdomen: Soft, gravid, appropriate for gestational age.  Pain/Pressure: Present     ?Pelvic: Cervical exam performed Dilation: Closed Effacement (%): 40 Station: -2  ?Extremities: Normal range of motion.  Edema: None  ?Mental Status:  Normal mood and affect. Normal behavior. Normal judgment and thought content.  ? ?Assessment and Plan:  ?Pregnancy: G3P1011 at [redacted]w[redacted]d ? ?1. [redacted] weeks  gestation of pregnancy ? ? ?2. Supervision of high risk pregnancy, antepartum ?Continue routine care ? ?- GC/Chlamydia probe amp ([place])not at [facility] ?- Culture, beta strep (group b only) ? ?3. IUGR (intrauterine growth restriction) affecting care of mother, third trimester, fetus 1 ?Pt has BPP tomorrow, schedule IOL at next visit at 38-39 weeks per MFM ? ?4. History of postpartum hemorrhage, currently pregnant in third trimester ?Reviewed pt's chart and she did have brisk bleeding after delivery treated with pitocin and cytotec, recommend TXA at delivery ? ?Term labor symptoms and general obstetric precautions including but not limited to vaginal bleeding, contractions, leaking of fluid and fetal movement were reviewed in detail with the patient. ? ?Please refer to After Visit Summary for other counseling recommendations.  ? ?Return in about 1 week (around 06/20/2021) for ROB, in person. ? ? ?Lynnda Shields, MD ?Faculty Attending ?Center for [place] ?  ? ?

## 2021-06-14 ENCOUNTER — Ambulatory Visit: Payer: Medicaid Other | Admitting: *Deleted

## 2021-06-14 ENCOUNTER — Ambulatory Visit: Payer: Medicaid Other | Attending: Obstetrics and Gynecology

## 2021-06-14 VITALS — BP 109/60 | HR 95

## 2021-06-14 DIAGNOSIS — O0990 Supervision of high risk pregnancy, unspecified, unspecified trimester: Secondary | ICD-10-CM

## 2021-06-14 DIAGNOSIS — O365930 Maternal care for other known or suspected poor fetal growth, third trimester, not applicable or unspecified: Secondary | ICD-10-CM | POA: Diagnosis not present

## 2021-06-14 DIAGNOSIS — O365931 Maternal care for other known or suspected poor fetal growth, third trimester, fetus 1: Secondary | ICD-10-CM | POA: Diagnosis present

## 2021-06-14 DIAGNOSIS — O0933 Supervision of pregnancy with insufficient antenatal care, third trimester: Secondary | ICD-10-CM | POA: Diagnosis not present

## 2021-06-14 DIAGNOSIS — O09293 Supervision of pregnancy with other poor reproductive or obstetric history, third trimester: Secondary | ICD-10-CM | POA: Diagnosis not present

## 2021-06-14 DIAGNOSIS — E669 Obesity, unspecified: Secondary | ICD-10-CM

## 2021-06-14 DIAGNOSIS — O09893 Supervision of other high risk pregnancies, third trimester: Secondary | ICD-10-CM | POA: Diagnosis not present

## 2021-06-14 DIAGNOSIS — Z3A36 36 weeks gestation of pregnancy: Secondary | ICD-10-CM

## 2021-06-14 DIAGNOSIS — O99213 Obesity complicating pregnancy, third trimester: Secondary | ICD-10-CM | POA: Diagnosis not present

## 2021-06-14 LAB — GC/CHLAMYDIA PROBE AMP (~~LOC~~) NOT AT ARMC
Chlamydia: NEGATIVE
Comment: NEGATIVE
Comment: NORMAL
Neisseria Gonorrhea: NEGATIVE

## 2021-06-17 LAB — CULTURE, BETA STREP (GROUP B ONLY): Strep Gp B Culture: NEGATIVE

## 2021-06-20 ENCOUNTER — Telehealth (HOSPITAL_COMMUNITY): Payer: Self-pay | Admitting: *Deleted

## 2021-06-20 ENCOUNTER — Encounter: Payer: Self-pay | Admitting: Medical

## 2021-06-20 ENCOUNTER — Ambulatory Visit (INDEPENDENT_AMBULATORY_CARE_PROVIDER_SITE_OTHER): Payer: Medicaid Other | Admitting: Medical

## 2021-06-20 VITALS — BP 111/78 | HR 90 | Wt 161.4 lb

## 2021-06-20 DIAGNOSIS — O99013 Anemia complicating pregnancy, third trimester: Secondary | ICD-10-CM

## 2021-06-20 DIAGNOSIS — Z3A37 37 weeks gestation of pregnancy: Secondary | ICD-10-CM

## 2021-06-20 DIAGNOSIS — O0990 Supervision of high risk pregnancy, unspecified, unspecified trimester: Secondary | ICD-10-CM | POA: Diagnosis not present

## 2021-06-20 DIAGNOSIS — O365931 Maternal care for other known or suspected poor fetal growth, third trimester, fetus 1: Secondary | ICD-10-CM

## 2021-06-20 NOTE — Progress Notes (Signed)
? ?  PRENATAL VISIT NOTE ? ?Subjective:  ?Sabrina Hahn is a 26 y.o. G3P1011 at [redacted]w[redacted]d being seen today for ongoing prenatal care.  She is currently monitored for the following issues for this high-risk pregnancy and has Supervision of high risk pregnancy, antepartum; Late prenatal care affecting pregnancy in third trimester; IUGR (intrauterine growth restriction) affecting care of mother, third trimester, fetus 1; Food insecurity; Anemia in pregnancy; and History of postpartum hemorrhage, currently pregnant in third trimester on their problem list. ? ?Patient reports no complaints.  Contractions: Not present. Vag. Bleeding: None.  Movement: Present. Denies leaking of fluid.  ? ?The following portions of the patient's history were reviewed and updated as appropriate: allergies, current medications, past family history, past medical history, past social history, past surgical history and problem list.  ? ?Objective:  ? ?Vitals:  ? 06/20/21 0859  ?BP: 111/78  ?Pulse: 90  ?Weight: 161 lb 6.4 oz (73.2 kg)  ? ? ?Fetal Status: Fetal Heart Rate (bpm): 140 Fundal Height: 36 cm Movement: Present    ? ?General:  Alert, oriented and cooperative. Patient is in no acute distress.  ?Skin: Skin is warm and dry. No rash noted.   ?Cardiovascular: Normal heart rate noted  ?Respiratory: Normal respiratory effort, no problems with respiration noted  ?Abdomen: Soft, gravid, appropriate for gestational age.  Pain/Pressure: Absent     ?Pelvic: Cervical exam deferred        ?Extremities: Normal range of motion.  Edema: None  ?Mental Status: Normal mood and affect. Normal behavior. Normal judgment and thought content.  ? ?Assessment and Plan:  ?Pregnancy: G3P1011 at [redacted]w[redacted]d ?1. Supervision of high risk pregnancy, antepartum ?- GBS negative discussed with patient  ? ?2. IUGR (intrauterine growth restriction) affecting care of mother, third trimester, fetus 1 ?- IOL recommended 38-39 weeks  ?- Scheduled for midnight on 4/8 ? ?3. Anemia during  pregnancy in third trimester ? ?4. [redacted] weeks gestation of pregnancy ? ?Term labor symptoms and general obstetric precautions including but not limited to vaginal bleeding, contractions, leaking of fluid and fetal movement were reviewed in detail with the patient. ?Please refer to After Visit Summary for other counseling recommendations.  ? ?Return for IOL 06/25/21 - will schedule PP from hospital . ? ?Future Appointments  ?Date Time Provider [place]  ?06/22/2021 10:15 AM [facility] NURSE [facility]  ?06/22/2021 10:30 AM [facility] US2 [facility]  ?06/25/2021  7:30 AM [facility] SCHED ROOM [facility] None  ? ? ?Kerry Hough, PA-C ? ?

## 2021-06-20 NOTE — Telephone Encounter (Signed)
Preadmission screen  

## 2021-06-20 NOTE — Progress Notes (Signed)
Subjective:  ?Sabrina Hahn is a 26 y.o. G3P1011 at [redacted]w[redacted]d being seen today for ongoing prenatal care.  She is currently monitored for the following issues for this high-risk pregnancy and has Supervision of high risk pregnancy, antepartum; Late prenatal care affecting pregnancy in third trimester; IUGR (intrauterine growth restriction) affecting care of mother, third trimester, fetus 1; Food insecurity; Anemia in pregnancy; and History of postpartum hemorrhage, currently pregnant in third trimester on their problem list. ? ?Patient reports fatigue.  Contractions: Not present. Vag. Bleeding: None.  Movement: Present. Denies leaking of fluid.  ? ?The following portions of the patient's history were reviewed and updated as appropriate: allergies, current medications, past family history, past medical history, past social history, past surgical history and problem list. Problem list updated. ? ?Objective:  ? ?Vitals:  ? 06/20/21 0859  ?BP: 111/78  ?Pulse: 90  ?Weight: 161 lb 6.4 oz (73.2 kg)  ? ? ?Fetal Status: Fetal Heart Rate (bpm): 140 Fundal Height: 36 cm Movement: Present    ? ?General:  Alert, oriented and cooperative. Patient is in no acute distress.  ?Skin: Skin is warm and dry. No rash noted.   ?Cardiovascular: Normal heart rate noted  ?Respiratory: Normal respiratory effort, no problems with respiration noted  ?Abdomen: Soft, gravid, appropriate for gestational age. Pain/Pressure: Absent     ?Pelvic: Vag. Bleeding: None     ?Cervical exam deferred        ?Extremities: Normal range of motion.  Edema: None  ?Mental Status: Normal mood and affect. Normal behavior. Normal judgment and thought content.  ? ?Urinalysis:     ? ?Assessment and Plan:  ?Pregnancy: G3P1011 at [redacted]w[redacted]d ? ?1. [redacted] weeks gestation of pregnancy ? ?2. Supervision of high risk pregnancy, antepartum ?-Continue routine care. ? ?3. IUGR (intrauterine growth restriction) affecting care of mother, third trimester, fetus 1 ?-F/U with MFM 04/05 for BPP  and doppler. ?Scheduled IOL for 06/25/21. ? ?4. History of postpartum hemorrhage, currently pregnant in third trimester ?-Brisk bleeding noted after delivery. ?-Treated with Pitocin and Cytotec. ? ?There are no diagnoses linked to this encounter. ?Term labor symptoms and general obstetric precautions including but not limited to vaginal bleeding, contractions, leaking of fluid and fetal movement were reviewed in detail with the patient. ?Please refer to After Visit Summary for other counseling recommendations.  ?No follow-ups on file. ? ? ?Chevis Pretty, Student-PA  ?

## 2021-06-21 ENCOUNTER — Telehealth (HOSPITAL_COMMUNITY): Payer: Self-pay | Admitting: *Deleted

## 2021-06-21 ENCOUNTER — Encounter (HOSPITAL_COMMUNITY): Payer: Self-pay | Admitting: *Deleted

## 2021-06-21 NOTE — Telephone Encounter (Signed)
Preadmission screen  

## 2021-06-22 ENCOUNTER — Encounter: Payer: Self-pay | Admitting: *Deleted

## 2021-06-22 ENCOUNTER — Other Ambulatory Visit: Payer: Self-pay | Admitting: Advanced Practice Midwife

## 2021-06-22 ENCOUNTER — Ambulatory Visit: Payer: Medicaid Other | Attending: Obstetrics and Gynecology

## 2021-06-22 ENCOUNTER — Ambulatory Visit: Payer: Medicaid Other | Admitting: *Deleted

## 2021-06-22 VITALS — BP 113/57 | HR 87

## 2021-06-22 DIAGNOSIS — O0990 Supervision of high risk pregnancy, unspecified, unspecified trimester: Secondary | ICD-10-CM | POA: Diagnosis present

## 2021-06-22 DIAGNOSIS — O09893 Supervision of other high risk pregnancies, third trimester: Secondary | ICD-10-CM | POA: Diagnosis present

## 2021-06-22 DIAGNOSIS — O365931 Maternal care for other known or suspected poor fetal growth, third trimester, fetus 1: Secondary | ICD-10-CM | POA: Insufficient documentation

## 2021-06-22 DIAGNOSIS — O0933 Supervision of pregnancy with insufficient antenatal care, third trimester: Secondary | ICD-10-CM | POA: Diagnosis not present

## 2021-06-22 DIAGNOSIS — E669 Obesity, unspecified: Secondary | ICD-10-CM

## 2021-06-22 DIAGNOSIS — O365930 Maternal care for other known or suspected poor fetal growth, third trimester, not applicable or unspecified: Secondary | ICD-10-CM

## 2021-06-22 DIAGNOSIS — Z3A37 37 weeks gestation of pregnancy: Secondary | ICD-10-CM

## 2021-06-22 DIAGNOSIS — O99213 Obesity complicating pregnancy, third trimester: Secondary | ICD-10-CM | POA: Insufficient documentation

## 2021-06-25 ENCOUNTER — Inpatient Hospital Stay (HOSPITAL_COMMUNITY)
Admission: AD | Admit: 2021-06-25 | Discharge: 2021-06-28 | DRG: 807 | Disposition: A | Discharge status: Home / Self Care | Payer: Medicaid Other | Attending: Obstetrics & Gynecology | Admitting: Obstetrics & Gynecology

## 2021-06-25 ENCOUNTER — Encounter (HOSPITAL_COMMUNITY): Payer: Self-pay | Admitting: Obstetrics and Gynecology

## 2021-06-25 ENCOUNTER — Inpatient Hospital Stay (HOSPITAL_COMMUNITY): Payer: Medicaid Other

## 2021-06-25 ENCOUNTER — Other Ambulatory Visit: Payer: Self-pay

## 2021-06-25 DIAGNOSIS — O365990 Maternal care for other known or suspected poor fetal growth, unspecified trimester, not applicable or unspecified: Secondary | ICD-10-CM | POA: Diagnosis present

## 2021-06-25 DIAGNOSIS — O09293 Supervision of pregnancy with other poor reproductive or obstetric history, third trimester: Secondary | ICD-10-CM

## 2021-06-25 DIAGNOSIS — O365930 Maternal care for other known or suspected poor fetal growth, third trimester, not applicable or unspecified: Secondary | ICD-10-CM | POA: Diagnosis present

## 2021-06-25 DIAGNOSIS — O0933 Supervision of pregnancy with insufficient antenatal care, third trimester: Secondary | ICD-10-CM

## 2021-06-25 DIAGNOSIS — O9902 Anemia complicating childbirth: Secondary | ICD-10-CM | POA: Diagnosis present

## 2021-06-25 DIAGNOSIS — Z5941 Food insecurity: Secondary | ICD-10-CM | POA: Diagnosis present

## 2021-06-25 DIAGNOSIS — Z880 Allergy status to penicillin: Secondary | ICD-10-CM

## 2021-06-25 DIAGNOSIS — O0990 Supervision of high risk pregnancy, unspecified, unspecified trimester: Secondary | ICD-10-CM

## 2021-06-25 DIAGNOSIS — D509 Iron deficiency anemia, unspecified: Secondary | ICD-10-CM | POA: Diagnosis present

## 2021-06-25 DIAGNOSIS — Z3A38 38 weeks gestation of pregnancy: Secondary | ICD-10-CM | POA: Diagnosis not present

## 2021-06-25 DIAGNOSIS — O365931 Maternal care for other known or suspected poor fetal growth, third trimester, fetus 1: Secondary | ICD-10-CM | POA: Diagnosis present

## 2021-06-25 DIAGNOSIS — O99019 Anemia complicating pregnancy, unspecified trimester: Secondary | ICD-10-CM | POA: Diagnosis present

## 2021-06-25 LAB — CBC
HCT: 29.9 % — ABNORMAL LOW (ref 36.0–46.0)
Hemoglobin: 9.9 g/dL — ABNORMAL LOW (ref 12.0–15.0)
MCH: 28 pg (ref 26.0–34.0)
MCHC: 33.1 g/dL (ref 30.0–36.0)
MCV: 84.5 fL (ref 80.0–100.0)
Platelets: 305 10*3/uL (ref 150–400)
RBC: 3.54 MIL/uL — ABNORMAL LOW (ref 3.87–5.11)
RDW: 14.7 % (ref 11.5–15.5)
WBC: 11.4 10*3/uL — ABNORMAL HIGH (ref 4.0–10.5)
nRBC: 0 % (ref 0.0–0.2)

## 2021-06-25 LAB — TYPE AND SCREEN
ABO/RH(D): O POS
Antibody Screen: NEGATIVE

## 2021-06-25 MED ORDER — MISOPROSTOL 25 MCG QUARTER TABLET
25.0000 ug | ORAL_TABLET | ORAL | Status: DC | PRN
Start: 2021-06-25 — End: 2021-06-26
  Administered 2021-06-25: 25 ug via VAGINAL
  Filled 2021-06-25 (×2): qty 1

## 2021-06-25 MED ORDER — TERBUTALINE SULFATE 1 MG/ML IJ SOLN: 0.2500 mg | Freq: Once | INTRAMUSCULAR | Status: DC | PRN

## 2021-06-25 MED ORDER — OXYCODONE-ACETAMINOPHEN 5-325 MG PO TABS: 2.0000 | ORAL_TABLET | ORAL | Status: DC | PRN

## 2021-06-25 MED ORDER — LACTATED RINGERS IV SOLN: INTRAVENOUS | Status: DC

## 2021-06-25 MED ORDER — LIDOCAINE HCL (PF) 1 % IJ SOLN
30.0000 mL | INTRAMUSCULAR | Status: AC | PRN
  Administered 2021-06-26: 30 mL via SUBCUTANEOUS
  Filled 2021-06-25: qty 30

## 2021-06-25 MED ORDER — OXYTOCIN BOLUS FROM INFUSION
333.0000 mL | Freq: Once | INTRAVENOUS | Status: AC
  Administered 2021-06-26: 333 mL via INTRAVENOUS

## 2021-06-25 MED ORDER — OXYCODONE-ACETAMINOPHEN 5-325 MG PO TABS
1.0000 | ORAL_TABLET | ORAL | Status: DC | PRN
  Administered 2021-06-26: 1 via ORAL
  Filled 2021-06-25: qty 1

## 2021-06-25 MED ORDER — ONDANSETRON HCL 4 MG/2ML IJ SOLN: 4.0000 mg | Freq: Four times a day (QID) | INTRAMUSCULAR | Status: DC | PRN

## 2021-06-25 MED ORDER — ACETAMINOPHEN 325 MG PO TABS: 650.0000 mg | ORAL_TABLET | ORAL | Status: DC | PRN

## 2021-06-25 MED ORDER — LACTATED RINGERS IV SOLN: 500.0000 mL | INTRAVENOUS | Status: DC | PRN

## 2021-06-25 MED ORDER — OXYTOCIN-SODIUM CHLORIDE 30-0.9 UT/500ML-% IV SOLN
2.5000 [IU]/h | INTRAVENOUS | Status: DC
Start: 2021-06-25 — End: 2021-06-26
  Administered 2021-06-26: 2.5 [IU]/h via INTRAVENOUS
  Filled 2021-06-25: qty 500

## 2021-06-25 MED ORDER — SOD CITRATE-CITRIC ACID 500-334 MG/5ML PO SOLN: 30.0000 mL | ORAL | Status: DC | PRN

## 2021-06-25 MED ORDER — TRANEXAMIC ACID-NACL 1000-0.7 MG/100ML-% IV SOLN
1000.0000 mg | Freq: Once | INTRAVENOUS | Status: DC
  Filled 2021-06-25: qty 100

## 2021-06-25 NOTE — Progress Notes (Signed)
? ?  Sabrina Hahn is a 26 y.o. G3P1011 at [redacted]w[redacted]d  admitted for induction of labor due to FGR . ? ?Subjective: ? ?Doing well, feeling some contractions.  ? ?Objective: ?Vitals:  ? 06/25/21 1652 06/25/21 1918  ?BP: 117/73 108/66  ?Pulse: (!) 108 84  ?Resp: 18   ?Temp: 98.2 ?F (36.8 ?C) 98.2 ?F (36.8 ?C)  ?TempSrc: Oral Oral  ?Weight: 73 kg   ?Height: 5\' 1"  (1.549 m)   ? ?No intake/output data recorded. ? ?FHT:  FHR: 130 bpm, variability: moderate,  accelerations:  Present,  decelerations:  Absent ?UC:   irregular, every 3-4 minutes ?SVE:   Dilation: Fingertip ?Effacement (%): 50 ?Station: -3 ?Exam by:: A Showfety RN ?Pitocin @ 0 mu/min ? ?Labs: ?Lab Results  ?Component Value Date  ? WBC 11.4 (H) 06/25/2021  ? HGB 9.9 (L) 06/25/2021  ? HCT 29.9 (L) 06/25/2021  ? MCV 84.5 06/25/2021  ? PLT 305 06/25/2021  ? ? ?Assessment / Plan: ?Early labor; she is due for cytotec at 10:30; will reassess and discuss possible FB at that time. Patient is unsure of FB right now; she wants to think about it.  ? ?Labor:  early labor ?Fetal Wellbeing:  Category I ?Pain Control:  Labor support without medications ?Anticipated MOD:  NSVD ? ?08/25/2021 Sabrina Hahn ?06/25/2021, 9:48 PM ? ? ? ? ?

## 2021-06-25 NOTE — H&P (Signed)
HPI: Sabrina Hahn is a 26 y.o. year old G70P1011 female at [redacted]w[redacted]d weeks gestation by certain LMP who presents to labor and delivery for induction of labor for IUGR.  EFW 5th percentile at 32 weeks.  Last ultrasound 4/5 EFW 5-14, 9th percentile. ? ?Hx postpartum hemorrhage documented w/ last delivery but QBL not c/w/ PPH.  ? ?Hx Shoulder dystocia relieved with McRoberts and Suprapubic pressure w/ 7-13 baby. No injury.  ? ?Plans BTL. 30 day Papers signed 05/30/2021. ? ?Nursing Staff Provider  ?Office Location [facility] Dating  LMP  ?PNC Model [x]  Traditional ?[ ]  Centering ?[ ]  Mom-Baby Dyad    ?Language  English Anatomy  IUGR 5%  ?Flu Vaccine   05/16/21 Genetic/Carrier Screen  NIPS:   Low risk female ?AFP: Too late ?Horizon:negative   ?TDaP Vaccine   05/16/21 Hgb A1C or  ?GTT Early  ?Third trimester 81/95/110  ?COVID Vaccine Pfizer- 2 doses   LAB RESULTS   ?Rhogam  NA Blood Type O/Positive/-- (02/27 1523)   ?Baby Feeding Plan Bottle Antibody Negative (02/27 1523)  ?Contraception Tubal Rubella 4.55 (02/27 1523)  ?Circumcision Yes RPR Non Reactive (02/27 1523)   ?Pediatrician  [facility] HBsAg Negative (02/27 1523)   ?Support Person James(FOB) HCVAb  Negative 05-16-21  ?Prenatal Classes   HIV Non Reactive (02/27 1523)     ?BTL Consent 05/30/21 GBS    neg ?(For PCN allergy, check sensitivities)   ?VBAC Consent NA Pap  01-02-19 Negative  ?     ?DME Rx [ X] BP cuff ?[ X] Weight Scale Waterbirth  [ ]  Class [ ]  Consent [ ]  CNM visit  ?PHQ9 & GAD7 [X]  new OB ?[  ] 28 weeks  ?[  ] 36 weeks Induction  [ ]  Orders Entered [ ] Foley Y/N  ? ? ?OB History   ? ? Gravida  ?3  ? Para  ?1  ? Term  ?1  ? Preterm  ?   ? AB  ?1  ? Living  ?1  ?  ? ? SAB  ?   ? IAB  ?   ? Ectopic  ?1  ? Multiple  ?0  ? Live Births  ?1  ?   ?  ?  ? ?Past Medical History:  ?Diagnosis Date  ? Anemia   ? Ectopic pregnancy   ? History of postpartum hemorrhage   ? Medical history non-contributory   ? Missed period 01/02/2019  ? PID (acute pelvic inflammatory disease)    ? Women's annual routine gynecological examination 01/02/2019  ? ?Past Surgical History:  ?Procedure Laterality Date  ? NO PAST SURGERIES    ? ?Family History: family history includes Cancer in an other family member; Hypertension in her father and mother. ?Social History:  reports that she has never smoked. She has never used smokeless tobacco. She reports that she does not drink alcohol and does not use drugs. ? ? ?  ?Maternal Diabetes: No ?Genetic Screening: Normal ?Maternal Ultrasounds/Referrals: IUGR ?Fetal Ultrasounds or other Referrals:  Referred to Materal Fetal Medicine  ?Maternal Substance Abuse:  No ?Significant Maternal Medications:  None ?Significant Maternal Lab Results:  Group B Strep negative ?Other Comments:   IUGR w/ Nml dopplers  ? ?Review of Systems  ?Constitutional:  Negative for chills and fever.  ?Eyes:  Negative for visual disturbance.  ?Gastrointestinal:  Negative for abdominal pain, nausea and vomiting.  ?Genitourinary:  Negative for vaginal bleeding and vaginal discharge.  ?Neurological:  Negative for headaches.  ?Maternal  Medical History:  ?Reason for admission: Nausea. ?Induction of labor ? ?Contractions: Frequency: irregular.   ?Perceived severity is mild.   ?Fetal activity: Perceived fetal activity is normal.   ?Prenatal complications: IUGR.   ?No bleeding, PIH, infection, oligohydramnios or pre-eclampsia.   ?Prenatal Complications - Diabetes: none. ? ?Dilation: Fingertip ?Effacement (%): 50 ?Station: -3 ?Exam by:: A Showfety RN ?Blood pressure 108/66, pulse 84, temperature 98.2 ?F (36.8 ?C), temperature source Oral, resp. rate 18, height 5\' 1"  (1.549 m), weight 73 kg, last menstrual period 09/30/2020, unknown if currently breastfeeding. ?Maternal Exam:  ?Uterine Assessment: Contraction strength is mild.  Contraction frequency is irregular.  ?Abdomen: Patient reports no abdominal tenderness. Estimated fetal weight is 5-14 LB.   ?Fetal presentation: vertex ?Cervix: Cervix evaluated by  digital exam.   ?By RN ? ?Fetal Exam ?Fetal Monitor Review: Baseline rate: 135.  ?Variability: moderate (6-25 bpm).   ?Pattern: accelerations present and no decelerations.   ?Fetal State Assessment: Category I - tracings are normal. ?14 LB ?Physical Exam ?Constitutional:   ?   General: She is not in acute distress. ?   Appearance: Normal appearance. She is normal weight. She is not ill-appearing.  ?HENT:  ?   Mouth/Throat:  ?   Mouth: Mucous membranes are moist.  ?Eyes:  ?   Conjunctiva/sclera: Conjunctivae normal.  ?Cardiovascular:  ?   Rate and Rhythm: Normal rate.  ?Pulmonary:  ?   Effort: Pulmonary effort is normal. No respiratory distress.  ?   Breath sounds: Normal breath sounds.  ?Abdominal:  ?   Palpations: Abdomen is soft.  ?   Tenderness: There is no abdominal tenderness.  ?Musculoskeletal:  ?   Right lower leg: No edema.  ?   Left lower leg: No edema.  ?Skin: ?   General: Skin is warm and dry.  ?   Coloration: Skin is not pale.  ?Neurological:  ?   General: No focal deficit present.  ?   Mental Status: She is alert and oriented to person, place, and time.  ?Psychiatric:     ?   Mood and Affect: Mood normal.  ? Dilation: Fingertip ?Effacement (%): 50 ?Cervical Position: Posterior ?Station: -3 ?Presentation: Vertex ?Exam by:: A Showfety RN ? ?Prenatal labs: ?ABO, Rh: --/--/O POS (04/08 1801) ?Antibody: NEG (04/08 1801) ?Rubella: 4.55 (02/27 1523) ?RPR: Non Reactive (02/27 1523)  ?HBsAg: Negative (02/27 1523)  ?HIV: Non Reactive (02/27 1523)  ?GBS: Negative/-- (03/27 1127)  ?Nips: Low risk female ?AFP: Too late ?GTT: Normal ? ?Assessment: ?1. Labor: Induction of labor for IUGR ?2. Fetal Wellbeing: Category 1 ?3. Pain Control: Comfort measures ?4. GBS: Negative ?5. 38.2 week IUP ?6. Possible history of postpartum hemorrhage ?7.  History of mild shoulder dystocia without injury and 7 pound 13 ounce infant. ? ?Plan:  ?1. Admit to BS per consult with MD ?2. Routine L&D orders ?3. Analgesia/anesthesia PRN  ?4.   Start induction with Cytotec.  Plan Foley for cervical ripening, Pitocin and AROM as needed. ?5.  We will give TXA prophylactically at delivery ?6.  Lower risk of shoulder dystocia with growth restricted baby approximately 2 pounds less than previous.  Will watch labor curve closely. ?7.  BTL postpartum ? ?04-04-1988 ?06/25/2021, 8:36 PM ? ? ? ? ?

## 2021-06-26 ENCOUNTER — Encounter (HOSPITAL_COMMUNITY): Payer: Self-pay | Admitting: Obstetrics and Gynecology

## 2021-06-26 DIAGNOSIS — O365930 Maternal care for other known or suspected poor fetal growth, third trimester, not applicable or unspecified: Secondary | ICD-10-CM

## 2021-06-26 DIAGNOSIS — Z3A38 38 weeks gestation of pregnancy: Secondary | ICD-10-CM

## 2021-06-26 LAB — RPR: RPR Ser Ql: NONREACTIVE

## 2021-06-26 MED ORDER — IBUPROFEN 600 MG PO TABS
600.0000 mg | ORAL_TABLET | Freq: Four times a day (QID) | ORAL | Status: DC
  Administered 2021-06-26 – 2021-06-28 (×8): 600 mg via ORAL
  Filled 2021-06-26 (×8): qty 1

## 2021-06-26 MED ORDER — OXYCODONE HCL 5 MG PO TABS
10.0000 mg | ORAL_TABLET | ORAL | Status: DC | PRN
  Administered 2021-06-26 – 2021-06-27 (×3): 10 mg via ORAL
  Filled 2021-06-26 (×3): qty 2

## 2021-06-26 MED ORDER — DIBUCAINE (PERIANAL) 1 % EX OINT: 1.0000 "application " | TOPICAL_OINTMENT | CUTANEOUS | Status: DC | PRN

## 2021-06-26 MED ORDER — EPHEDRINE 5 MG/ML INJ: 10.0000 mg | INTRAVENOUS | Status: DC | PRN

## 2021-06-26 MED ORDER — DIPHENHYDRAMINE HCL 25 MG PO CAPS: 25.0000 mg | ORAL_CAPSULE | Freq: Four times a day (QID) | ORAL | Status: DC | PRN

## 2021-06-26 MED ORDER — ONDANSETRON HCL 4 MG PO TABS: 4.0000 mg | ORAL_TABLET | ORAL | Status: DC | PRN

## 2021-06-26 MED ORDER — DOCUSATE SODIUM 100 MG PO CAPS
100.0000 mg | ORAL_CAPSULE | Freq: Two times a day (BID) | ORAL | Status: DC
  Administered 2021-06-26 – 2021-06-28 (×4): 100 mg via ORAL
  Filled 2021-06-26 (×5): qty 1

## 2021-06-26 MED ORDER — DIPHENHYDRAMINE HCL 50 MG/ML IJ SOLN: 12.5000 mg | INTRAMUSCULAR | Status: DC | PRN

## 2021-06-26 MED ORDER — SIMETHICONE 80 MG PO CHEW: 80.0000 mg | CHEWABLE_TABLET | ORAL | Status: DC | PRN

## 2021-06-26 MED ORDER — COCONUT OIL OIL
1.0000 | TOPICAL_OIL | Status: DC | PRN
Start: 2021-06-26 — End: 2021-06-28

## 2021-06-26 MED ORDER — PRENATAL MULTIVITAMIN CH
1.0000 | ORAL_TABLET | Freq: Every day | ORAL | Status: DC
  Administered 2021-06-26 – 2021-06-28 (×3): 1 via ORAL
  Filled 2021-06-26 (×3): qty 1

## 2021-06-26 MED ORDER — FENTANYL CITRATE (PF) 100 MCG/2ML IJ SOLN
100.0000 ug | INTRAMUSCULAR | Status: DC | PRN
  Administered 2021-06-26: 100 ug via INTRAVENOUS
  Filled 2021-06-26: qty 2

## 2021-06-26 MED ORDER — FENTANYL-BUPIVACAINE-NACL 0.5-0.125-0.9 MG/250ML-% EP SOLN: 12.0000 mL/h | EPIDURAL | Status: DC | PRN

## 2021-06-26 MED ORDER — WITCH HAZEL-GLYCERIN EX PADS: 1.0000 "application " | MEDICATED_PAD | CUTANEOUS | Status: DC | PRN

## 2021-06-26 MED ORDER — PHENYLEPHRINE 40 MCG/ML (10ML) SYRINGE FOR IV PUSH (FOR BLOOD PRESSURE SUPPORT): 80.0000 ug | PREFILLED_SYRINGE | INTRAVENOUS | Status: DC | PRN

## 2021-06-26 MED ORDER — IBUPROFEN 800 MG PO TABS
800.0000 mg | ORAL_TABLET | Freq: Once | ORAL | Status: AC
  Administered 2021-06-26: 800 mg via ORAL
  Filled 2021-06-26: qty 1

## 2021-06-26 MED ORDER — OXYCODONE HCL 5 MG PO TABS
5.0000 mg | ORAL_TABLET | ORAL | Status: DC | PRN
  Administered 2021-06-26: 5 mg via ORAL
  Filled 2021-06-26: qty 1

## 2021-06-26 MED ORDER — LACTATED RINGERS IV SOLN: 500.0000 mL | Freq: Once | INTRAVENOUS | Status: DC

## 2021-06-26 MED ORDER — ACETAMINOPHEN 325 MG PO TABS
650.0000 mg | ORAL_TABLET | ORAL | Status: DC | PRN
  Administered 2021-06-26 – 2021-06-27 (×5): 650 mg via ORAL
  Filled 2021-06-26 (×5): qty 2

## 2021-06-26 MED ORDER — BENZOCAINE-MENTHOL 20-0.5 % EX AERO: 1.0000 "application " | INHALATION_SPRAY | CUTANEOUS | Status: DC | PRN

## 2021-06-26 MED ORDER — ONDANSETRON HCL 4 MG/2ML IJ SOLN: 4.0000 mg | INTRAMUSCULAR | Status: DC | PRN

## 2021-06-26 NOTE — Progress Notes (Signed)
Patient ID: Sabrina Hahn, female   DOB: 12-16-1995, 26 y.o.   MRN: [id number] ? ? ?Sabrina Hahn is a 26 y.o. G3P1011 at [redacted]w[redacted]d  admitted for IOL for FGR ? ?Subjective: ? ?Patient is in pain; she is refusing external fetal monitors ?Objective: ?Vitals:  ? 06/25/21 1652 06/25/21 1918 06/25/21 2128 06/25/21 2353  ?BP: 117/73 108/66 106/60 (!) 100/55  ?Pulse: (!) 108 84 94 (!) 107  ?Resp: 18     ?Temp: 98.2 ?F (36.8 ?C) 98.2 ?F (36.8 ?C)  99.1 ?F (37.3 ?C)  ?TempSrc: Oral Oral  Oral  ?Weight: 73 kg     ?Height: 5\' 1"  (1.549 m)     ? ?No intake/output data recorded. ? ?FHT:  FHR: 125 bpm, variability: moderate,  accelerations:  Present,  decelerations:  Absent ?UC:   irregular, every 1-2 minutes ?SVE:   Dilation: 5 ?Effacement (%): 50 ?Station: -2 ?Exam by:: [email or web address], CNM ?Pitocin @ 0 mu/min ? ?Labs: ?Lab Results  ?Component Value Date  ? WBC 11.4 (H) 06/25/2021  ? HGB 9.9 (L) 06/25/2021  ? HCT 29.9 (L) 06/25/2021  ? MCV 84.5 06/25/2021  ? PLT 305 06/25/2021  ? ? ?Assessment / Plan: ?Patient is very uncomfortable, requesting IV medicine. Her contractions appear to be every 2 min and so will hold off on fentanyl.  ?She does not want her EFM and toco on; given Cat 1 tracing and the fact that last cytotec was over 6 hours ago, will allow patient to have intermittent monitoring for now.  ?Labor: Progressing normally ?Fetal Wellbeing:  Category I ?Pain Control:  Labor support without medications ?Anticipated MOD:  NSVD ? ?08/25/2021 Sabrina Hahn ?06/26/2021, 1:51 AM ? ? ? ? ?

## 2021-06-26 NOTE — Plan of Care (Signed)
?  Problem: Education: ?Goal: Knowledge of General Education information will improve ?Description: Including pain rating scale, medication(s)/side effects and non-pharmacologic comfort measures ?Outcome: Completed/Met ?  ?Problem: Health Behavior/Discharge Planning: ?Goal: Ability to manage health-related needs will improve ?Outcome: Completed/Met ?  ?Problem: Clinical Measurements: ?Goal: Ability to maintain clinical measurements within normal limits will improve ?Outcome: Completed/Met ?Goal: Will remain free from infection ?Outcome: Completed/Met ?Goal: Diagnostic test results will improve ?Outcome: Completed/Met ?Goal: Respiratory complications will improve ?Outcome: Completed/Met ?Goal: Cardiovascular complication will be avoided ?Outcome: Completed/Met ?  ?Problem: Activity: ?Goal: Risk for activity intolerance will decrease ?Outcome: Completed/Met ?  ?Problem: Nutrition: ?Goal: Adequate nutrition will be maintained ?Outcome: Completed/Met ?  ?Problem: Coping: ?Goal: Level of anxiety will decrease ?Outcome: Completed/Met ?  ?Problem: Elimination: ?Goal: Will not experience complications related to bowel motility ?Outcome: Completed/Met ?Goal: Will not experience complications related to urinary retention ?Outcome: Completed/Met ?  ?Problem: Pain Managment: ?Goal: General experience of comfort will improve ?Outcome: Completed/Met ?  ?Problem: Safety: ?Goal: Ability to remain free from injury will improve ?Outcome: Completed/Met ?  ?Problem: Skin Integrity: ?Goal: Risk for impaired skin integrity will decrease ?Outcome: Completed/Met ?  ?Problem: Education: ?Goal: Knowledge of Childbirth will improve ?Outcome: Completed/Met ?Goal: Ability to make informed decisions regarding treatment and plan of care will improve ?Outcome: Completed/Met ?Goal: Ability to state and carry out methods to decrease the pain will improve ?Outcome: Completed/Met ?Goal: Individualized Educational Video(s) ?Outcome: Completed/Met ?   ?Problem: Coping: ?Goal: Ability to verbalize concerns and feelings about labor and delivery will improve ?Outcome: Completed/Met ?  ?Problem: Life Cycle: ?Goal: Ability to make normal progression through stages of labor will improve ?Outcome: Completed/Met ?Goal: Ability to effectively push during vaginal delivery will improve ?Outcome: Completed/Met ?  ?Problem: Role Relationship: ?Goal: Will demonstrate positive interactions with the child ?Outcome: Completed/Met ?  ?Problem: Safety: ?Goal: Risk of complications during labor and delivery will decrease ?Outcome: Completed/Met ?  ?Problem: Pain Management: ?Goal: Relief or control of pain from uterine contractions will improve ?Outcome: Completed/Met ?  ?

## 2021-06-26 NOTE — Discharge Summary (Signed)
? ?  Postpartum Discharge Summary ? ?   ?Patient Name: Sabrina Hahn ?DOB: 29-Dec-1995 ?MRN: [id number] ? ?Date of admission: 06/25/2021 ?Delivery date:06/26/2021  ?Delivering provider: Lauretta Chester NILES  ?Date of discharge: 06/28/2021 ? ?Admitting diagnosis: IUGR (intrauterine growth restriction) affecting care of mother [I96.7893] ?Intrauterine pregnancy: [redacted]w[redacted]d    ?Secondary diagnosis:  Principal Problem: ?  Vaginal delivery ?Active Problems: ?  Supervision of high risk pregnancy, antepartum ?  Late prenatal care affecting pregnancy in third trimester ?  IUGR (intrauterine growth restriction) affecting care of mother, third trimester, fetus 1 ?  Food insecurity ?  Iron deficiency anemia during pregnancy ?  History of postpartum hemorrhage, currently pregnant in third trimester ?  IUGR (intrauterine growth restriction) affecting care of mother ?  Laceration, obstetrical, second degree ? ?Additional problems: None     ?Discharge diagnosis: Term Pregnancy Delivered                                              ?Post partum procedures: None ?Augmentation: AROM and Cytotec ?Complications: None ? ?Hospital course: Induction of Labor With Vaginal Delivery   ?26y.o. yo G3P1011 at 316w3das admitted to the hospital 06/25/2021 for induction of labor.  Indication for induction:  IUGR .  Patient had an uncomplicated labor course and vaginal delivery.  ?Membrane Rupture Time/Date: 9:29 AM ,06/26/2021   ?Delivery Method:Vaginal, Spontaneous  ?Episiotomy: None  ?Lacerations:  2nd degree  ?Details of delivery can be found in separate delivery note.  Patient had a routine postpartum course.  She is eating, drinking, ambulating, and voiding without issue.  Her pain and bleeding is controlled.  She is formula feeding well.  Patient is discharged home 06/28/21. ? ?Newborn Data: ?Birth date:06/26/2021  ?Birth time:10:22 AM  ?Gender:Female  ?Living status:Living  ?Apgars:9 ,9  ?Weight:2690 g  ? ?Magnesium Sulfate received: No ?BMZ received:  No ?Rhophylac: N/A ?MMR: N/A ?T-DaP: Given prenatally ?Flu: Yes - given prenatally  ?Transfusion: No ? ?Physical exam  ?Vitals:  ? 06/27/21 0530 06/27/21 1619 06/27/21 1936 06/28/21 [id number]?BP: 115/67 109/68 130/83 114/76  ?Pulse: 70 88 82 75  ?Resp: 18 18 18 18   ?Temp: 98.2 ?F (36.8 ?C) 98 ?F (36.7 ?C) 98.5 ?F (36.9 ?C) 98.2 ?F (36.8 ?C)  ?TempSrc: Oral Oral Oral Oral  ?SpO2:  100%    ?Weight:      ?Height:      ? ?General: alert, cooperative, and no distress ?Lochia: appropriate ?Uterine Fundus: firm and below umbilicus  ?DVT Evaluation: no LE edema or calf tenderness to palpation  ? ?Labs: ?Lab Results  ?Component Value Date  ? WBC 11.4 (H) 06/25/2021  ? HGB 9.9 (L) 06/25/2021  ? HCT 29.9 (L) 06/25/2021  ? MCV 84.5 06/25/2021  ? PLT 305 06/25/2021  ? ? ?  Latest Ref Rng & Units 03/26/2020  ? 11:13 AM  ?CMP  ?Glucose 70 - 99 mg/dL 88    ?BUN 6 - 20 mg/dL <5    ?Creatinine 0.44 - 1.00 mg/dL 0.47    ?Sodium 135 - 145 mmol/L 132    ?Potassium 3.5 - 5.1 mmol/L 3.8    ?Chloride 98 - 111 mmol/L 102    ?CO2 22 - 32 mmol/L 18    ?Calcium 8.9 - 10.3 mg/dL 8.7    ?Total Protein 6.5 - 8.1 g/dL 5.8    ?  Total Bilirubin 0.3 - 1.2 mg/dL 0.4    ?Alkaline Phos 38 - 126 U/L 75    ?AST 15 - 41 U/L 19    ?ALT 0 - 44 U/L 14    ? ?Edinburgh Score: ? ?  06/27/2021  ? 11:38 AM  ?Flavia Shipper Postnatal Depression Scale Screening Tool  ?I have been able to laugh and see the funny side of things. 0  ?I have looked forward with enjoyment to things. 0  ?I have blamed myself unnecessarily when things went wrong. 0  ?I have been anxious or worried for no good reason. 0  ?I have felt scared or panicky for no good reason. 0  ?Things have been getting on top of me. 0  ?I have been so unhappy that I have had difficulty sleeping. 0  ?I have felt sad or miserable. 0  ?I have been so unhappy that I have been crying. 0  ?The thought of harming myself has occurred to me. 0  ?Edinburgh Postnatal Depression Scale Total 0  ? ? ? ?After visit meds:  ?Allergies as  of 06/28/2021   ? ?   Reactions  ? Cinnamon Cough  ? ?  ? ?  ?Medication List  ?  ? ?TAKE these medications   ? ?acetaminophen 500 MG tablet ?Commonly known as: TYLENOL ?Take 2 tablets (1,000 mg total) by mouth every 8 (eight) hours as needed (pain). ?What changed:  ?medication strength ?how much to take ?when to take this ?reasons to take this ?  ?ibuprofen 600 MG tablet ?Commonly known as: ADVIL ?Take 1 tablet (600 mg total) by mouth every 6 (six) hours as needed (pain). ?  ?iron polysaccharides 150 MG capsule ?Commonly known as: NIFEREX ?Take 1 capsule (150 mg total) by mouth daily. ?  ?prenatal multivitamin Tabs tablet ?Take 1 tablet by mouth daily at 12 noon. ?  ? ?  ? ? ? ?Discharge home in stable condition ?Infant Feeding:  Formula ?Infant Disposition: Home with mother ?Discharge instruction: per After Visit Summary and Postpartum booklet. ?Activity: Advance as tolerated. Pelvic rest for 6 weeks.  ?Diet: routine diet ?Future Appointments: ?Future Appointments  ?Date Time Provider [place]  ?08/04/2021  2:35 PM Caren Macadam, MD [facility]  ? ?Follow up Visit: ?Please schedule this patient for Postpartum visit in: 4 weeks with the following provider: MD ?For C/S patients schedule nurse incision check in weeks 2 weeks: No  ?High risk pregnancy complicated by: IUGR  ?Delivery mode:  SVD  ?Anticipated Birth Control:  Interval BTL - message sent by Dr. Gwenlyn Perking on 4/11 to set up pre-op appointment  ?PP Procedures needed: None  ? ?06/28/2021 ?Genia Del, MD ? ? ? ?

## 2021-06-26 NOTE — Lactation Note (Addendum)
This note was copied from a baby's chart. ?Lactation Consultation Note ? ?Patient Name: Sabrina Hahn ?Today's Date: 06/26/2021 ?Age:27 hours ? ?LC in to room for initial visit. Mother is asleep. Support person states mother is formula feeding exclusively.  ? ? ? ?Feeding ?Mother's Current Feeding Choice: Formula ?Nipple Type: Slow - flow ? ?Consult Status ?Consult Status: Complete ? ? ? ?Rebbecca Osuna A Higuera Ancidey ?06/26/2021, 1:45 PM ? ? ? ?

## 2021-06-27 ENCOUNTER — Encounter (HOSPITAL_COMMUNITY): Payer: Self-pay | Admitting: Anesthesiology

## 2021-06-27 NOTE — Progress Notes (Signed)
POSTPARTUM PROGRESS NOTE ? ?Post Partum Day 1 ? ?Subjective: ? ?Sabrina Hahn is a 26 y.o. [id number] s/p NSVD at [redacted]w[redacted]d.  She reports she is doing well. No acute events overnight. She denies any problems with ambulating, voiding or po intake. Denies nausea or vomiting.  Pain is well controlled.  Lochia is like a period. ? ?Objective: ?Blood pressure 115/67, pulse 70, temperature 98.2 ?F (36.8 ?C), temperature source Oral, resp. rate 18, height 5\' 1"  (1.549 m), weight 73 kg, last menstrual period 09/30/2020, SpO2 99 %, unknown if currently breastfeeding. ? ?Physical Exam:  ?General: alert, cooperative and no distress ?Chest: no respiratory distress ?Heart:regular rate, distal pulses intact ?Abdomen: soft, nontender,  ?Uterine Fundus: firm, appropriately tender ?DVT Evaluation: No calf swelling or tenderness ?Extremities: minimal edema ?Skin: warm, dry ? ?Recent Labs  ?  06/25/21 ?1658  ?HGB 9.9*  ?HCT 29.9*  ? ? ?Assessment/Plan: ?Sabrina Hahn is a 26 y.o. 34 s/p NSVD at [redacted]w[redacted]d  ? ?PPD#1 - Doing well ? Routine postpartum care ?Anemia: PO iron every other day ?Contraception: desires BTL. Due to schedule constraints patient offered and accepts delay until tomorrow morning. Counseled extensively about anesthesia for procedure, she is very hesitant to do spinal anesthesia. After much counseling she is sure she would like general anesthesia.  ? ?Patient desires permanent sterilization.  Other reversible forms of contraception were discussed with patient; she declines all other modalities. Risks of procedure discussed with patient including but not limited to: risk of regret, permanence of method, bleeding, infection, injury to surrounding organs and need for additional procedures.  Failure risk of about 1% with increased risk of ectopic gestation if pregnancy occurs was also discussed with patient.   ? ?Feeding: Bottle ?Dispo: Plan for discharge PPD #2/POD#0. ? ? LOS: 2 days  ? ?[redacted]w[redacted]d, MD/MPH ?Attending  Family Medicine Physician, Faculty Practice ?Center for [facility], [facility] ?  ?06/27/2021, 10:06 AM ? ?

## 2021-06-28 ENCOUNTER — Encounter (HOSPITAL_COMMUNITY)
Admission: AD | Disposition: A | Discharge status: Home / Self Care | Payer: Self-pay | Source: Home / Self Care | Attending: Obstetrics & Gynecology

## 2021-06-28 SURGERY — LIGATION, FALLOPIAN TUBE, POSTPARTUM
Anesthesia: Choice | Laterality: Bilateral

## 2021-06-28 MED ORDER — FAMOTIDINE 20 MG PO TABS: 40.0000 mg | ORAL_TABLET | Freq: Once | ORAL | Status: DC

## 2021-06-28 MED ORDER — LACTATED RINGERS IV SOLN: INTRAVENOUS | Status: DC

## 2021-06-28 MED ORDER — IBUPROFEN 600 MG PO TABS: 600.0000 mg | ORAL_TABLET | Freq: Four times a day (QID) | ORAL | 0 refills | Status: DC | PRN

## 2021-06-28 MED ORDER — METOCLOPRAMIDE HCL 10 MG PO TABS: 10.0000 mg | ORAL_TABLET | Freq: Once | ORAL | Status: DC

## 2021-06-28 MED ORDER — ACETAMINOPHEN 500 MG PO TABS: 1000.0000 mg | ORAL_TABLET | Freq: Three times a day (TID) | ORAL | 0 refills | Status: DC | PRN

## 2021-06-28 NOTE — Anesthesia Preprocedure Evaluation (Deleted)
Anesthesia Evaluation  ? ? ?Reviewed: ?Allergy & Precautions, Patient's Chart, lab work & pertinent test results ? ?Airway ? ? ? ? ? ? ? Dental ?  ?Pulmonary ?neg pulmonary ROS,  ?  ? ? ? ? ? ? ? Cardiovascular ?negative cardio ROS ? ? ? ? ?  ?Neuro/Psych ?negative neurological ROS ?   ? GI/Hepatic ?negative GI ROS, Neg liver ROS,   ?Endo/Other  ?negative endocrine ROS ? Renal/GU ?negative Renal ROS  ? ?  ?Musculoskeletal ?negative musculoskeletal ROS ?(+)  ? Abdominal ?  ?Peds ? Hematology ? ?(+) Blood dyscrasia, anemia , Hb 9.9, plt 305   ?Anesthesia Other Findings ? ? Reproductive/Obstetrics ?Desires sterilization, delivered 48h ago ? ?  ? ? ? ? ? ? ? ? ? ? ? ? ? ?  ?  ? ? ? ? ? ? ? ? ?Anesthesia Physical ?Anesthesia Plan ? ?ASA: 2 ? ?Anesthesia Plan: Spinal  ? ?Post-op Pain Management: Regional block, Toradol IV (intra-op) and Ofirmev IV (intra-op)*  ? ?Induction:  ? ?PONV Risk Score and Plan: 3 and Ondansetron, Dexamethasone and Treatment may vary due to age or medical condition ? ?Airway Management Planned: Natural Airway and Nasal Cannula ? ?Additional Equipment: None ? ?Intra-op Plan:  ? ?Post-operative Plan:  ? ?Informed Consent:  ? ?Plan Discussed with:  ? ?Anesthesia Plan Comments:   ? ? ? ? ? ? ?Anesthesia Quick Evaluation ? ?

## 2021-07-04 ENCOUNTER — Encounter: Payer: Self-pay | Admitting: *Deleted

## 2021-07-04 DIAGNOSIS — O0990 Supervision of high risk pregnancy, unspecified, unspecified trimester: Secondary | ICD-10-CM

## 2021-07-05 ENCOUNTER — Ambulatory Visit (INDEPENDENT_AMBULATORY_CARE_PROVIDER_SITE_OTHER): Payer: Medicaid Other | Admitting: Obstetrics and Gynecology

## 2021-07-05 VITALS — BP 132/102 | HR 87 | Wt 141.8 lb

## 2021-07-05 DIAGNOSIS — O165 Unspecified maternal hypertension, complicating the puerperium: Secondary | ICD-10-CM

## 2021-07-05 DIAGNOSIS — Z302 Encounter for sterilization: Secondary | ICD-10-CM

## 2021-07-05 MED ORDER — KETOROLAC TROMETHAMINE 10 MG PO TABS: 10.0000 mg | ORAL_TABLET | Freq: Four times a day (QID) | ORAL | 0 refills | Status: DC | PRN

## 2021-07-05 MED ORDER — AMLODIPINE BESYLATE 5 MG PO TABS: 5.0000 mg | ORAL_TABLET | Freq: Every day | ORAL | 0 refills | Status: DC

## 2021-07-05 NOTE — Progress Notes (Signed)
?Obstetrics and Gynecology ?New Patient Evaluation ? ?Appointment Date: 07/05/2021 ? ?OBGYN Clinic: Center for [facility] ? ?Primary Care Provider: Wendall Hahn ? ?Chief Complaint: BTL consult ? ?History of Present Illness: Sabrina Hahn is a 26 y.o. African-American [id number] (Patient's last menstrual period was 09/30/2020.), seen for the above chief complaint. Her past medical history is significant for nothing. ? ?Patient just delivered on 4/9 but unfortunately her papers were not valid for 30 days yet. She had an uncomplicated VD and was discharged to home; pregnancy only c/b FGR ? ?Sabrina Hahn is doing   ? ?She denies any s/s of pre-eclampsia and only endorses abdominal cramping at night helped somewhat with apap, motrin and a heating pad ? ?Review of Systems: Pertinent items noted in HPI and remainder of comprehensive ROS otherwise negative.  ? ?Patient Active Problem List  ? Diagnosis Date Noted  ? Vaginal delivery 06/26/2021  ? Laceration, obstetrical, second degree 06/26/2021  ? IUGR (intrauterine growth restriction) affecting care of mother 06/25/2021  ? History of postpartum hemorrhage, currently pregnant in third trimester 06/13/2021  ? Iron deficiency anemia during pregnancy 05/17/2021  ? Food insecurity 05/16/2021  ? Late prenatal care affecting pregnancy in third trimester 05/12/2021  ? IUGR (intrauterine growth restriction) affecting care of mother, third trimester, fetus 1 05/12/2021  ? ? ?Past Medical History:  ?Past Medical History:  ?Diagnosis Date  ? Anemia   ? Ectopic pregnancy   ? History of postpartum hemorrhage   ? Medical history non-contributory   ? Missed period 01/02/2019  ? PID (acute pelvic inflammatory disease)   ? Women's annual routine gynecological examination 01/02/2019  ? ? ?Past Surgical History:  ?Past Surgical History:  ?Procedure Laterality Date  ? NO PAST SURGERIES    ? ? ?Past Obstetrical History:  ?OB History  ?Gravida Para Term Preterm AB  Living  ?3 2 2   1 2   ?SAB IAB Ectopic Multiple Live Births  ?    1 0 2  ?  ?# Outcome Date GA Lbr Len/2nd Weight Sex Delivery Anes PTL Lv  ?3 Term 06/26/21 [redacted]w[redacted]d 11:11 / 00:01 5 lb 14.9 oz (2.69 kg) M Vag-Spont None  LIV  ?2 Term 05/28/20 [redacted]w[redacted]d 02:06 / 00:19 7 lb 13.2 oz (3.549 kg) F Vag-Spont Local  LIV  ?1 Ectopic           ? ? ?Past Gynecological History: As per HPI. ?History of Pap Smear(s): Yes.   Last pap 2022, which was negative ? ? ?Social History:  ?Social History  ? ?Socioeconomic History  ? Marital status: Single  ?  Spouse name: Not on file  ? Number of children: Not on file  ? Years of education: Not on file  ? Highest education level: Not on file  ?Occupational History  ? Not on file  ?Tobacco Use  ? Smoking status: Never  ? Smokeless tobacco: Never  ?Vaping Use  ? Vaping Use: Never used  ?Substance and Sexual Activity  ? Alcohol use: No  ? Drug use: No  ? Sexual activity: Yes  ?  Birth control/protection: None  ?Other Topics Concern  ? Not on file  ?Social History Narrative  ? Not on file  ? ?Social Determinants of Health  ? ?Financial Resource Strain: Not on file  ?Food Insecurity: No Food Insecurity  ? Worried About Charity fundraiser in the Last Year: Never true  ? Ran Out of Food in the Last Year: Never true  ?Transportation  Needs: No Transportation Needs  ? Lack of Transportation (Medical): No  ? Lack of Transportation (Non-Medical): No  ?Physical Activity: Not on file  ?Stress: Not on file  ?Social Connections: Not on file  ?Intimate Partner Violence: Not on file  ? ? ?Family History:  ?Family History  ?Problem Relation Age of Onset  ? Cancer Other   ? Hypertension Mother   ? Hypertension Father   ? ? ? ?Medications ?Sabrina Hahn had no medications administered during this visit. ?Current Outpatient Medications  ?Medication Sig Dispense Refill  ? acetaminophen (TYLENOL) 500 MG tablet Take 2 tablets (1,000 mg total) by mouth every 8 (eight) hours as needed (pain). 60 tablet 0  ? iron  polysaccharides (NIFEREX) 150 MG capsule Take 1 capsule (150 mg total) by mouth daily. 30 capsule 2  ? Prenatal Vit-Fe Fumarate-FA (PRENATAL MULTIVITAMIN) TABS tablet Take 1 tablet by mouth daily at 12 noon.    ? ?No current facility-administered medications for this visit.  ? ? ?Allergies ?Cinnamon ? ? ?Physical Exam:  ?BP (!) 132/102   Pulse 87   Wt 141 lb 12.8 oz (64.3 kg)   LMP 09/30/2020   Breastfeeding No   BMI 26.79 kg/m?  Body mass index is 26.79 kg/m?. ?Vitals:  ? 07/05/21 1443 07/05/21 1452  ?BP: (!) 134/97 (!) 132/102  ?Pulse: 85 87  ?Weight: 141 lb 12.8 oz (64.3 kg)   ? ?General appearance: Well nourished, well developed female in no acute distress.  ?Neck:  Supple, normal appearance, and no thyromegaly  ?Cardiovascular: normal s1 and s2.  No murmurs, rubs or gallops. ?Respiratory:  Clear to auscultation bilateral. Normal respiratory effort ?Abdomen: soft, nttp ?Neuro/Psych:  Normal mood and affect.  ?Skin:  Warm and dry.  ? ?Laboratory: none ? ?Radiology: no new imaging ? ?Assessment: pt stable ? ?Plan:  ?1. Postpartum hypertension ?Check labs today and pre-eclampsia precautions given. Pt amenable to starting norvasc 5mg ; 10 minutes ?- CBC ?- Comprehensive metabolic panel ? ?2. Contraception ?Reviewed with her re: BTL in terms of surgical risks, failure risk (1/100), recovery time, permanency, risk of regret and she would like to proceed. Plan for l/s BTL via salpingectomy as patient prefers this method; request sent to OR ? ?RTC 1wk for BP check ? ?Durene Romans MD ?Attending ?Center for [employer] [facility] [employer] [facility]) ? ?

## 2021-07-05 NOTE — Patient Instructions (Signed)
Continue to check your blood pressures twice a day Call the office for blood pressures that are consistently above 155 for the top number or 105 for the bottom number   Hypertension During Pregnancy Hypertension is also called high blood pressure. High blood pressure means that the force of your blood moving in your body is too strong. It can cause problems for you and your baby. Different types of high blood pressure can happen during pregnancy. The types are: High blood pressure before you got pregnant. This is called chronic hypertension.  This can continue during your pregnancy. Your doctor will want to keep checking your blood pressure. You may need medicine to keep your blood pressure under control while you are pregnant. You will need follow-up visits after you have your baby. High blood pressure that goes up during pregnancy when it was normal before. This is called gestational hypertension. It will usually get better after you have your baby, but your doctor will need to watch your blood pressure to make sure that it is getting better. Very high blood pressure during pregnancy. This is called preeclampsia. Very high blood pressure is an emergency that needs to be checked and treated right away. You may develop very high blood pressure after giving birth. This is called postpartum preeclampsia. This usually occurs within 48 hours after childbirth but may occur up to 6 weeks after giving birth. This is rare. How does this affect me? If you have high blood pressure during pregnancy, you have a higher chance of developing high blood pressure: As you get older. If you get pregnant again. In some cases, high blood pressure during pregnancy can cause: Stroke. Heart attack. Damage to the kidneys, lungs, or liver. Preeclampsia. Jerky movements you cannot control (convulsions or seizures). Problems with the placenta.  What can I do to lower my risk?  Keep a healthy weight. Eat a healthy  diet. Follow what your doctor tells you about treating any medical problems that you had before becoming pregnant. It is very important to go to all of your doctor visits. Your doctor will check your blood pressure and make sure that your pregnancy is progressing as it should. Treatment should start early if a problem is found.  Follow these instructions at home:  Take your blood pressure 1-2 times per day. Call the office if your blood pressure is 155 or higher for the top number or 105 or higher for the bottom number.    Eating and drinking  Drink enough fluid to keep your pee (urine) pale yellow. Avoid caffeine. Lifestyle Do not use any products that contain nicotine or tobacco, such as cigarettes, e-cigarettes, and chewing tobacco. If you need help quitting, ask your doctor. Do not use alcohol or drugs. Avoid stress. Rest and get plenty of sleep. Regular exercise can help. Ask your doctor what kinds of exercise are best for you. General instructions Take over-the-counter and prescription medicines only as told by your doctor. Keep all prenatal and follow-up visits as told by your doctor. This is important. Contact a doctor if: You have symptoms that your doctor told you to watch for, such as: Headaches. Nausea. Vomiting. Belly (abdominal) pain. Dizziness. Light-headedness. Get help right away if: You have: Very bad belly pain that does not get better with treatment. A very bad headache that does not get better. Vomiting that does not get better. Sudden, fast weight gain. Sudden swelling in your hands, ankles, or face. Blood in your pee. Blurry vision. Double vision.   Shortness of breath. Chest pain. Weakness on one side of your body. Trouble talking. Summary High blood pressure is also called hypertension. High blood pressure means that the force of your blood moving in your body is too strong. High blood pressure can cause problems for you and your baby. Keep all  follow-up visits as told by your doctor. This is important. This information is not intended to replace advice given to you by your health care provider. Make sure you discuss any questions you have with your health care provider. Document Released: 04/08/2010 Document Revised: 06/27/2018 Document Reviewed: 04/02/2018 Elsevier Patient Education  2020 Elsevier Inc.  

## 2021-07-06 DIAGNOSIS — O165 Unspecified maternal hypertension, complicating the puerperium: Secondary | ICD-10-CM | POA: Insufficient documentation

## 2021-07-06 LAB — COMPREHENSIVE METABOLIC PANEL
ALT: 10 IU/L (ref 0–32)
AST: 12 IU/L (ref 0–40)
Albumin/Globulin Ratio: 1.3 (ref 1.2–2.2)
Albumin: 3.7 g/dL — ABNORMAL LOW (ref 3.9–5.0)
Alkaline Phosphatase: 106 IU/L (ref 44–121)
BUN/Creatinine Ratio: 15 (ref 9–23)
BUN: 9 mg/dL (ref 6–20)
Bilirubin Total: <0.2 mg/dL (ref 0.0–1.2)
CO2: 20 mmol/L (ref 20–29)
Calcium: 9.3 mg/dL (ref 8.7–10.2)
Chloride: 106 mmol/L (ref 96–106)
Creatinine, Ser: 0.6 mg/dL (ref 0.57–1.00)
Globulin, Total: 2.8 g/dL (ref 1.5–4.5)
Glucose: 91 mg/dL (ref 70–99)
Potassium: 4.5 mmol/L (ref 3.5–5.2)
Sodium: 139 mmol/L (ref 134–144)
Total Protein: 6.5 g/dL (ref 6.0–8.5)
eGFR: 126 mL/min/{1.73_m2} (ref >59)

## 2021-07-06 LAB — CBC
Hematocrit: 37 % (ref 34.0–46.6)
Hemoglobin: 12 g/dL (ref 11.1–15.9)
MCH: 27.1 pg (ref 26.6–33.0)
MCHC: 32.4 g/dL (ref 31.5–35.7)
MCV: 84 fL (ref 79–97)
Platelets: 472 10*3/uL — ABNORMAL HIGH (ref 150–450)
RBC: 4.43 x10E6/uL (ref 3.77–5.28)
RDW: 14.5 % (ref 11.7–15.4)
WBC: 10.6 10*3/uL (ref 3.4–10.8)

## 2021-07-08 ENCOUNTER — Telehealth (HOSPITAL_COMMUNITY): Payer: Self-pay | Admitting: *Deleted

## 2021-07-08 NOTE — Telephone Encounter (Signed)
Opened chart accidentally. ? ?Odis Hollingshead, RN 07-08-2021 at 10:41am ?

## 2021-07-12 ENCOUNTER — Ambulatory Visit (INDEPENDENT_AMBULATORY_CARE_PROVIDER_SITE_OTHER): Payer: Medicaid Other

## 2021-07-12 ENCOUNTER — Other Ambulatory Visit: Payer: Self-pay | Admitting: Obstetrics and Gynecology

## 2021-07-12 DIAGNOSIS — Z0130 Encounter for examination of blood pressure without abnormal findings: Secondary | ICD-10-CM

## 2021-07-12 DIAGNOSIS — Z01818 Encounter for other preprocedural examination: Secondary | ICD-10-CM

## 2021-07-12 NOTE — Progress Notes (Signed)
Patient did not wait to see RN. Rescheduled appt for 07/14/21. ? ?Marjo Bicker, RN ?07/12/2021  1:26 PM ? ? ?

## 2021-07-14 ENCOUNTER — Ambulatory Visit: Payer: Medicaid Other

## 2021-08-04 ENCOUNTER — Ambulatory Visit (INDEPENDENT_AMBULATORY_CARE_PROVIDER_SITE_OTHER): Payer: Medicaid Other | Admitting: Family Medicine

## 2021-08-04 NOTE — Progress Notes (Signed)
    Post Partum Visit Note  Sabrina Hahn is a 26 y.o. [id number] female who presents for a postpartum visit. She is 5 weeks postpartum following a normal spontaneous vaginal delivery.  I have fully reviewed the prenatal and intrapartum course. The delivery was at 38.3 gestational weeks.  Anesthesia: local.  Baby is feeding by bottle - Gerber Gentle Pro . Bleeding no bleeding. Bowel function is normal. Bladder function is normal. Patient is not sexually active. Contraception method is tubal ligation. Postpartum depression screening: negative.   Pt having interval tubal on 08/16/21 with Dr Vergie Living.  The pregnancy intention screening data noted above was reviewed. Potential methods of contraception were discussed. The patient elected to proceed with No data recorded.  *Intake performed by RN and the patient left without being seen by provider  Knows about interval tubal date and time  Federico Flake, MD, MPH, ABFM, [facility] Attending Physician Center for [facility]

## 2021-08-08 NOTE — Progress Notes (Signed)
Surgical Instructions    Your procedure is scheduled on Aug 16 2021.  Report to [facility] Main Entrance "A" at 5:30 A.M., then check in with the Admitting office.  Call this number if you have problems the morning of surgery:  [phone]   If you have any questions prior to your surgery date call [phone]: Open Monday-Friday 8am-4pm    Remember:  Do not eat after midnight the night before your surgery  You may drink clear liquids until 4:30am the morning of your surgery.   Clear liquids allowed are: Water, Non-Citrus Juices (without pulp), Carbonated Beverages, Clear Tea, Black Coffee ONLY (NO MILK, CREAM OR POWDERED CREAMER of any kind), and Gatorade    Take these medicines the morning of surgery with A SIP OF WATER:  amLODipine (NORVASC)  As of today, STOP taking any Aspirin (unless otherwise instructed by your surgeon) Aleve, Naproxen, Ibuprofen, Motrin, Advil, Goody's, BC's, all herbal medications, fish oil, and all vitamins.           Do not wear jewelry or makeup Do not wear lotions, powders, perfumes/colognes, or deodorant. Do not shave 48 hours prior to surgery.  Men may shave face and neck. Do not bring valuables to the hospital. Do not wear nail polish, gel polish, artificial nails, or any other type of covering on natural nails (fingers and toes) If you have artificial nails or gel coating that need to be removed by a nail salon, please have this removed prior to surgery. Artificial nails or gel coating may interfere with anesthesia's ability to adequately monitor your vital signs.  [place] is not responsible for any belongings or valuables. .   Do NOT Smoke (Tobacco/Vaping)  24 hours prior to your procedure  If you use a CPAP at night, you may bring your mask for your overnight stay.   Contacts, glasses, hearing aids, dentures or partials may not be worn into surgery, please bring cases for these belongings   For patients admitted to the hospital,  discharge time will be determined by your treatment team.   Patients discharged the day of surgery will not be allowed to drive home, and someone needs to stay with them for 24 hours.   SURGICAL WAITING ROOM VISITATION Patients having surgery or a procedure in a hospital may have two support people. Children under the age of 42 must have an adult with them who is not the patient. They may stay in the waiting area during the procedure and may switch out with other visitors. If the patient needs to stay at the hospital during part of their recovery, the visitor guidelines for inpatient rooms apply.  Please refer to the [facility] website for the visitor guidelines for Inpatients (after your surgery is over and you are in a regular room).       Special instructions:    Oral Hygiene is also important to reduce your risk of infection.  Remember - BRUSH YOUR TEETH THE MORNING OF SURGERY WITH YOUR REGULAR TOOTHPASTE   [place]- Preparing For Surgery  Before surgery, you can play an important role. Because skin is not sterile, your skin needs to be as free of germs as possible. You can reduce the number of germs on your skin by washing with CHG (chlorahexidine gluconate) Soap before surgery.  CHG is an antiseptic cleaner which kills germs and bonds with the skin to continue killing germs even after washing.     Please do not use if you have an allergy  to CHG or antibacterial soaps. If your skin becomes reddened/irritated stop using the CHG.  Do not shave (including legs and underarms) for at least 48 hours prior to first CHG shower. It is OK to shave your face.  Please follow these instructions carefully.     Shower the NIGHT BEFORE SURGERY and the MORNING OF SURGERY with CHG Soap.   If you chose to wash your hair, wash your hair first as usual with your normal shampoo. After you shampoo, rinse your hair and body thoroughly to remove the shampoo.  Then [employer] and genitals (private  parts) with your normal soap and rinse thoroughly to remove soap.  After that Use CHG Soap as you would any other liquid soap. You can apply CHG directly to the skin and wash gently with a scrungie or a clean washcloth.   Apply the CHG Soap to your body ONLY FROM THE NECK DOWN.  Do not use on open wounds or open sores. Avoid contact with your eyes, ears, mouth and genitals (private parts). Wash Face and genitals (private parts)  with your normal soap.   Wash thoroughly, paying special attention to the area where your surgery will be performed.  Thoroughly rinse your body with warm water from the neck down.  DO NOT shower/wash with your normal soap after using and rinsing off the CHG Soap.  Pat yourself dry with a CLEAN TOWEL.  Wear CLEAN PAJAMAS to bed the night before surgery  Place CLEAN SHEETS on your bed the night before your surgery  DO NOT SLEEP WITH PETS.   Day of Surgery:  Take a shower with CHG soap. Wear Clean/Comfortable clothing the morning of surgery Do not apply any deodorants/lotions.   Remember to brush your teeth WITH YOUR REGULAR TOOTHPASTE.    If you received a COVID test during your pre-op visit, it is requested that you wear a mask when out in public, stay away from anyone that may not be feeling well, and notify your surgeon if you develop symptoms. If you have been in contact with anyone that has tested positive in the last 10 days, please notify your surgeon.    Please read over the following fact sheets that you were given.

## 2021-08-09 ENCOUNTER — Inpatient Hospital Stay (HOSPITAL_COMMUNITY)
Admission: RE | Admit: 2021-08-09 | Discharge: 2021-08-09 | Disposition: A | Discharge status: Home / Self Care | Payer: Medicaid Other | Source: Ambulatory Visit

## 2021-08-11 ENCOUNTER — Encounter (HOSPITAL_COMMUNITY): Payer: Self-pay | Admitting: Obstetrics and Gynecology

## 2021-08-11 NOTE — Progress Notes (Signed)
Ms Sabrina Hahn denies chest pain or shortness of breath. Patient denies having any s/s of Covid in her household.  Patient denies any known exposure to Covid.  PCP is Royal Hawthorn, NP.  Ms. Sabrina Hahn was hypertensive after the birth of child, patient was started on Amlodipine, for a month, she no longer take Amlodipine.  Postparturm visit on 08/04/21, blood pressure was  114/76.

## 2021-08-15 NOTE — Anesthesia Preprocedure Evaluation (Addendum)
Anesthesia Evaluation  Patient identified by MRN, date of birth, ID band Patient awake    Reviewed: Allergy & Precautions, H&P , NPO status , Patient's Chart, lab work & pertinent test results  Airway Mallampati: II  TM Distance: >3 FB Neck ROM: Full    Dental no notable dental hx. (+) Teeth Intact, Dental Advisory Given   Pulmonary neg pulmonary ROS,    Pulmonary exam normal breath sounds clear to auscultation       Cardiovascular hypertension, Pt. on medications  Rhythm:Regular Rate:Normal     Neuro/Psych negative neurological ROS  negative psych ROS   GI/Hepatic negative GI ROS, Neg liver ROS,   Endo/Other  negative endocrine ROS  Renal/GU negative Renal ROS  negative genitourinary   Musculoskeletal negative musculoskeletal ROS (+)   Abdominal   Peds  Hematology  (+) Blood dyscrasia, anemia ,   Anesthesia Other Findings   Reproductive/Obstetrics negative OB ROS Desires sterility                            Anesthesia Physical Anesthesia Plan  ASA: 2  Anesthesia Plan: General   Post-op Pain Management: Tylenol PO (pre-op)* and Toradol IV (intra-op)*   Induction: Intravenous  PONV Risk Score and Plan: 3 and Ondansetron, Dexamethasone, Midazolam and Treatment may vary due to age or medical condition  Airway Management Planned: Oral ETT  Additional Equipment: None  Intra-op Plan:   Post-operative Plan: Extubation in OR  Informed Consent: I have reviewed the patients History and Physical, chart, labs and discussed the procedure including the risks, benefits and alternatives for the proposed anesthesia with the patient or authorized representative who has indicated his/her understanding and acceptance.     Dental advisory given  Plan Discussed with: CRNA  Anesthesia Plan Comments:        Anesthesia Quick Evaluation

## 2021-08-16 ENCOUNTER — Other Ambulatory Visit: Payer: Self-pay

## 2021-08-16 ENCOUNTER — Encounter (HOSPITAL_COMMUNITY)
Admission: RE | Disposition: A | Discharge status: Home / Self Care | Payer: Self-pay | Source: Home / Self Care | Attending: Obstetrics and Gynecology

## 2021-08-16 ENCOUNTER — Ambulatory Visit (HOSPITAL_BASED_OUTPATIENT_CLINIC_OR_DEPARTMENT_OTHER): Payer: Medicaid Other | Admitting: Anesthesiology

## 2021-08-16 ENCOUNTER — Ambulatory Visit (HOSPITAL_COMMUNITY): Payer: Medicaid Other | Admitting: Anesthesiology

## 2021-08-16 ENCOUNTER — Ambulatory Visit (HOSPITAL_COMMUNITY)
Admission: RE | Admit: 2021-08-16 | Discharge: 2021-08-16 | Disposition: A | Discharge status: Home / Self Care | Payer: Medicaid Other | Attending: Obstetrics and Gynecology | Admitting: Obstetrics and Gynecology

## 2021-08-16 ENCOUNTER — Encounter (HOSPITAL_COMMUNITY): Payer: Self-pay | Admitting: Obstetrics and Gynecology

## 2021-08-16 DIAGNOSIS — Z01818 Encounter for other preprocedural examination: Secondary | ICD-10-CM

## 2021-08-16 DIAGNOSIS — N8000 Endometriosis of the uterus, unspecified: Secondary | ICD-10-CM | POA: Insufficient documentation

## 2021-08-16 DIAGNOSIS — Z302 Encounter for sterilization: Secondary | ICD-10-CM

## 2021-08-16 DIAGNOSIS — Z9889 Other specified postprocedural states: Secondary | ICD-10-CM

## 2021-08-16 DIAGNOSIS — I10 Essential (primary) hypertension: Secondary | ICD-10-CM | POA: Diagnosis not present

## 2021-08-16 HISTORY — DX: Essential (primary) hypertension: I10

## 2021-08-16 HISTORY — PX: LAPAROSCOPIC BILATERAL SALPINGECTOMY: SHX5889

## 2021-08-16 HISTORY — DX: Pneumonia, unspecified organism: J18.9

## 2021-08-16 LAB — CBC
HCT: 38.2 % (ref 36.0–46.0)
Hemoglobin: 12.3 g/dL (ref 12.0–15.0)
MCH: 27.3 pg (ref 26.0–34.0)
MCHC: 32.2 g/dL (ref 30.0–36.0)
MCV: 84.9 fL (ref 80.0–100.0)
Platelets: 359 10*3/uL (ref 150–400)
RBC: 4.5 MIL/uL (ref 3.87–5.11)
RDW: 17.2 % — ABNORMAL HIGH (ref 11.5–15.5)
WBC: 10.5 10*3/uL (ref 4.0–10.5)
nRBC: 0 % (ref 0.0–0.2)

## 2021-08-16 LAB — BASIC METABOLIC PANEL
Anion gap: 5 (ref 5–15)
BUN: 8 mg/dL (ref 6–20)
CO2: 22 mmol/L (ref 22–32)
Calcium: 8.9 mg/dL (ref 8.9–10.3)
Chloride: 114 mmol/L — ABNORMAL HIGH (ref 98–111)
Creatinine, Ser: 0.71 mg/dL (ref 0.44–1.00)
GFR, Estimated: >60 mL/min (ref >60)
Glucose, Bld: 105 mg/dL — ABNORMAL HIGH (ref 70–99)
Potassium: 4.1 mmol/L (ref 3.5–5.1)
Sodium: 141 mmol/L (ref 135–145)

## 2021-08-16 LAB — POCT PREGNANCY, URINE: Preg Test, Ur: NEGATIVE

## 2021-08-16 SURGERY — SALPINGECTOMY, BILATERAL, LAPAROSCOPIC
Anesthesia: General | Site: Abdomen | Laterality: Bilateral

## 2021-08-16 MED ORDER — OXYCODONE HCL 5 MG/5ML PO SOLN: 5.0000 mg | Freq: Once | ORAL | Status: DC | PRN

## 2021-08-16 MED ORDER — FENTANYL CITRATE (PF) 250 MCG/5ML IJ SOLN
INTRAMUSCULAR | Status: DC | PRN
  Administered 2021-08-16: 100 ug via INTRAVENOUS
  Administered 2021-08-16: 50 ug via INTRAVENOUS

## 2021-08-16 MED ORDER — DEXAMETHASONE SODIUM PHOSPHATE 10 MG/ML IJ SOLN
INTRAMUSCULAR | Status: AC
  Filled 2021-08-16: qty 1

## 2021-08-16 MED ORDER — ROCURONIUM BROMIDE 10 MG/ML (PF) SYRINGE
PREFILLED_SYRINGE | INTRAVENOUS | Status: DC | PRN
  Administered 2021-08-16: 50 mg via INTRAVENOUS

## 2021-08-16 MED ORDER — DEXAMETHASONE SODIUM PHOSPHATE 10 MG/ML IJ SOLN
INTRAMUSCULAR | Status: DC | PRN
  Administered 2021-08-16: 10 mg via INTRAVENOUS

## 2021-08-16 MED ORDER — AMISULPRIDE (ANTIEMETIC) 5 MG/2ML IV SOLN: 10.0000 mg | Freq: Once | INTRAVENOUS | Status: DC | PRN

## 2021-08-16 MED ORDER — MEPERIDINE HCL 25 MG/ML IJ SOLN: 6.2500 mg | INTRAMUSCULAR | Status: DC | PRN

## 2021-08-16 MED ORDER — LIDOCAINE 2% (20 MG/ML) 5 ML SYRINGE
INTRAMUSCULAR | Status: AC
  Filled 2021-08-16: qty 5

## 2021-08-16 MED ORDER — ONDANSETRON HCL 4 MG/2ML IJ SOLN
INTRAMUSCULAR | Status: AC
  Filled 2021-08-16: qty 2

## 2021-08-16 MED ORDER — ORAL CARE MOUTH RINSE: 15.0000 mL | Freq: Once | OROMUCOSAL | Status: AC

## 2021-08-16 MED ORDER — CHLORHEXIDINE GLUCONATE 0.12 % MT SOLN
15.0000 mL | Freq: Once | OROMUCOSAL | Status: AC
  Administered 2021-08-16: 15 mL via OROMUCOSAL
  Filled 2021-08-16: qty 15

## 2021-08-16 MED ORDER — SILVER NITRATE-POT NITRATE 75-25 % EX MISC
CUTANEOUS | Status: DC | PRN
  Administered 2021-08-16: 4

## 2021-08-16 MED ORDER — ACETAMINOPHEN 500 MG PO TABS
1000.0000 mg | ORAL_TABLET | Freq: Once | ORAL | Status: AC
  Administered 2021-08-16: 1000 mg via ORAL
  Filled 2021-08-16: qty 2

## 2021-08-16 MED ORDER — HYDROMORPHONE HCL 1 MG/ML IJ SOLN
0.2500 mg | INTRAMUSCULAR | Status: DC | PRN
  Administered 2021-08-16: 0.25 mg via INTRAVENOUS

## 2021-08-16 MED ORDER — SILVER NITRATE-POT NITRATE 75-25 % EX MISC
CUTANEOUS | Status: AC
  Filled 2021-08-16: qty 10

## 2021-08-16 MED ORDER — LACTATED RINGERS IV SOLN: INTRAVENOUS | Status: DC

## 2021-08-16 MED ORDER — SCOPOLAMINE 1 MG/3DAYS TD PT72
1.0000 | MEDICATED_PATCH | TRANSDERMAL | Status: DC
  Administered 2021-08-16: 1.5 mg via TRANSDERMAL
  Filled 2021-08-16: qty 1

## 2021-08-16 MED ORDER — BUPIVACAINE HCL 0.5 % IJ SOLN
INTRAMUSCULAR | Status: DC | PRN
  Administered 2021-08-16: 20 mL

## 2021-08-16 MED ORDER — SUGAMMADEX SODIUM 200 MG/2ML IV SOLN
INTRAVENOUS | Status: DC | PRN
  Administered 2021-08-16: 200 mg via INTRAVENOUS

## 2021-08-16 MED ORDER — ONDANSETRON HCL 4 MG/2ML IJ SOLN
INTRAMUSCULAR | Status: DC | PRN
  Administered 2021-08-16: 4 mg via INTRAVENOUS

## 2021-08-16 MED ORDER — BUPIVACAINE HCL (PF) 0.5 % IJ SOLN
INTRAMUSCULAR | Status: AC
  Filled 2021-08-16: qty 30

## 2021-08-16 MED ORDER — KETOROLAC TROMETHAMINE 30 MG/ML IJ SOLN
INTRAMUSCULAR | Status: AC
  Filled 2021-08-16: qty 1

## 2021-08-16 MED ORDER — KETOROLAC TROMETHAMINE 30 MG/ML IJ SOLN
INTRAMUSCULAR | Status: DC | PRN
  Administered 2021-08-16: 30 mg via INTRAVENOUS

## 2021-08-16 MED ORDER — MIDAZOLAM HCL 5 MG/5ML IJ SOLN
INTRAMUSCULAR | Status: DC | PRN
  Administered 2021-08-16: 2 mg via INTRAVENOUS

## 2021-08-16 MED ORDER — ROCURONIUM BROMIDE 10 MG/ML (PF) SYRINGE
PREFILLED_SYRINGE | INTRAVENOUS | Status: AC
  Filled 2021-08-16: qty 10

## 2021-08-16 MED ORDER — MIDAZOLAM HCL 2 MG/2ML IJ SOLN
INTRAMUSCULAR | Status: AC
  Filled 2021-08-16: qty 2

## 2021-08-16 MED ORDER — 0.9 % SODIUM CHLORIDE (POUR BTL) OPTIME
TOPICAL | Status: DC | PRN
  Administered 2021-08-16: 1000 mL

## 2021-08-16 MED ORDER — ONDANSETRON HCL 4 MG/2ML IJ SOLN: 4.0000 mg | Freq: Once | INTRAMUSCULAR | Status: DC | PRN

## 2021-08-16 MED ORDER — PROPOFOL 10 MG/ML IV BOLUS
INTRAVENOUS | Status: DC | PRN
  Administered 2021-08-16: 150 mg via INTRAVENOUS

## 2021-08-16 MED ORDER — OXYCODONE-ACETAMINOPHEN 5-325 MG PO TABS: 1.0000 | ORAL_TABLET | Freq: Four times a day (QID) | ORAL | 0 refills | Status: AC | PRN

## 2021-08-16 MED ORDER — SODIUM CHLORIDE 0.9 % IV SOLN: INTRAVENOUS | Status: DC

## 2021-08-16 MED ORDER — DOCUSATE SODIUM 100 MG PO CAPS: 100.0000 mg | ORAL_CAPSULE | Freq: Two times a day (BID) | ORAL | 0 refills | Status: AC | PRN

## 2021-08-16 MED ORDER — IBUPROFEN 600 MG PO TABS: 600.0000 mg | ORAL_TABLET | Freq: Four times a day (QID) | ORAL | 0 refills | Status: AC | PRN

## 2021-08-16 MED ORDER — HYDROMORPHONE HCL 1 MG/ML IJ SOLN
INTRAMUSCULAR | Status: AC
  Filled 2021-08-16: qty 1

## 2021-08-16 MED ORDER — KETOROLAC TROMETHAMINE 30 MG/ML IJ SOLN
30.0000 mg | Freq: Once | INTRAMUSCULAR | Status: AC | PRN
  Administered 2021-08-16: 30 mg via INTRAVENOUS

## 2021-08-16 MED ORDER — OXYCODONE HCL 5 MG PO TABS: 5.0000 mg | ORAL_TABLET | Freq: Once | ORAL | Status: DC | PRN

## 2021-08-16 MED ORDER — LIDOCAINE 2% (20 MG/ML) 5 ML SYRINGE
INTRAMUSCULAR | Status: DC | PRN
  Administered 2021-08-16: 100 mg via INTRAVENOUS

## 2021-08-16 MED ORDER — PROPOFOL 10 MG/ML IV BOLUS
INTRAVENOUS | Status: AC
  Filled 2021-08-16: qty 20

## 2021-08-16 MED ORDER — FENTANYL CITRATE (PF) 250 MCG/5ML IJ SOLN
INTRAMUSCULAR | Status: AC
  Filled 2021-08-16: qty 5

## 2021-08-16 SURGICAL SUPPLY — 40 items
APPLICATOR COTTON TIP 6 STRL (MISCELLANEOUS) ×1 IMPLANT
APPLICATOR COTTON TIP 6IN STRL (MISCELLANEOUS)
BLADE SURG 15 STRL LF DISP TIS (BLADE) ×1 IMPLANT
BLADE SURG 15 STRL SS (BLADE) ×2
CABLE HIGH FREQUENCY MONO STRZ (ELECTRODE) IMPLANT
DEFOGGER SCOPE WARMER CLEARIFY (MISCELLANEOUS) ×2 IMPLANT
DERMABOND ADVANCED (GAUZE/BANDAGES/DRESSINGS) ×1
DERMABOND ADVANCED .7 DNX12 (GAUZE/BANDAGES/DRESSINGS) ×1 IMPLANT
DRSG OPSITE POSTOP 3X4 (GAUZE/BANDAGES/DRESSINGS) ×1 IMPLANT
DURAPREP 26ML APPLICATOR (WOUND CARE) ×2 IMPLANT
ELECT REM PT RETURN 9FT ADLT (ELECTROSURGICAL) ×2
ELECTRODE REM PT RTRN 9FT ADLT (ELECTROSURGICAL) ×1 IMPLANT
GLOVE BIOGEL PI IND STRL 7.0 (GLOVE) ×2 IMPLANT
GLOVE BIOGEL PI IND STRL 7.5 (GLOVE) ×2 IMPLANT
GLOVE BIOGEL PI INDICATOR 7.0 (GLOVE) ×2
GLOVE BIOGEL PI INDICATOR 7.5 (GLOVE) ×2
GLOVE SURG SS PI 7.0 STRL IVOR (GLOVE) ×2 IMPLANT
GOWN STRL REUS W/ TWL LRG LVL3 (GOWN DISPOSABLE) ×3 IMPLANT
GOWN STRL REUS W/TWL LRG LVL3 (GOWN DISPOSABLE) ×6
KIT TURNOVER KIT B (KITS) ×2 IMPLANT
LIGASURE VESSEL 5MM BLUNT TIP (ELECTROSURGICAL) ×1 IMPLANT
NS IRRIG 1000ML POUR BTL (IV SOLUTION) ×2 IMPLANT
PACK LAPAROSCOPY BASIN (CUSTOM PROCEDURE TRAY) ×2 IMPLANT
PACK TRENDGUARD 450 HYBRID PRO (MISCELLANEOUS) IMPLANT
PAD OB MATERNITY 4.3X12.25 (PERSONAL CARE ITEMS) ×2 IMPLANT
POUCH LAPAROSCOPIC INSTRUMENT (MISCELLANEOUS) ×2 IMPLANT
POUCH SPECIMEN RETRIEVAL 10MM (ENDOMECHANICALS) IMPLANT
PROTECTOR NERVE ULNAR (MISCELLANEOUS) ×4 IMPLANT
SCISSORS LAP 5X35 DISP (ENDOMECHANICALS) IMPLANT
SET TUBE SMOKE EVAC HIGH FLOW (TUBING) ×2 IMPLANT
SLEEVE ADV FIXATION 5X100MM (TROCAR) ×1 IMPLANT
SUT MON AB 4-0 PS1 27 (SUTURE) ×2 IMPLANT
SUT VICRYL 0 UR6 27IN ABS (SUTURE) ×2 IMPLANT
SYR 10ML LL (SYRINGE) ×2 IMPLANT
TOWEL GREEN STERILE FF (TOWEL DISPOSABLE) ×4 IMPLANT
TRAY FOLEY W/BAG SLVR 14FR (SET/KITS/TRAYS/PACK) ×2 IMPLANT
TRENDGUARD 450 HYBRID PRO PACK (MISCELLANEOUS) ×2
TROCAR ADV FIXATION 5X100MM (TROCAR) ×1 IMPLANT
TROCAR BALLN 12MMX100 BLUNT (TROCAR) ×1 IMPLANT
WARMER LAPAROSCOPE (MISCELLANEOUS) ×2 IMPLANT

## 2021-08-16 NOTE — Anesthesia Procedure Notes (Signed)
Procedure Name: Intubation Date/Time: 08/16/2021 7:34 AM Performed by: Maude Leriche, CRNA Pre-anesthesia Checklist: Patient identified, Emergency Drugs available, Suction available and Patient being monitored Patient Re-evaluated:Patient Re-evaluated prior to induction Oxygen Delivery Method: Circle system utilized Preoxygenation: Pre-oxygenation with 100% oxygen Induction Type: IV induction Ventilation: Mask ventilation without difficulty Laryngoscope Size: Miller and 2 Grade View: Grade I Tube type: Oral Tube size: 7.0 mm Number of attempts: 1 Airway Equipment and Method: Stylet and Bite block Placement Confirmation: ETT inserted through vocal cords under direct vision, positive ETCO2 and breath sounds checked- equal and bilateral Secured at: 21 cm Tube secured with: Tape Dental Injury: Teeth and Oropharynx as per pre-operative assessment

## 2021-08-16 NOTE — Discharge Instructions (Signed)
Laparoscopic Surgery Discharge Instructions  Instructions Following Laparoscopic Surgery You have just undergone a laparoscopic surgery.  The following list should answer your most common questions.  Although we will discuss your surgery and post-operative instructions with you prior to your discharge, this list will serve as a reminder if you fail to recall the details of what we discussed.  We will discuss your surgery once again in detail at your post-op visit in two to four weeks. If you haven't already done so, please call to make your appointment as soon as possible.  How you will feel: Although you have just undergone a major surgery, your recovery will be significantly shorter since the surgery was performed through much smaller incisions than the traditional approach.  You should feel slightly better each day.  If you suddenly feel much worse than the prior day, please call the clinic.  It's important during the early part of your recovery that you maintain some activity.  Walking is encouraged.  You will quicken your recovery by continued activity.  Incision:  Your incisions will be closed with dissolvable stitches or surgical adhesive (glue).  There may be Band-aids and/or Steri-strips covering your incisions.  If there is no drainage from the incisions you may remove the Band-aids in one to two days.  You may notice some minor bruising at the incision sites.  This is common and will resolve within several days.  Please inform us if the redness at the edges of your incision appears to be spreading.  If the skin around your incision becomes warm to the touch, or if you notice a pus-like drainage, please call the office.  Stairs/Driving/Activities: You may climb stairs if necessary.  If you've had general anesthesia, do not drive a car the rest of the day today.  You may begin light housework when you feel up to it, but avoid heavy lifting (more than 15-20lbs) or pushing until cleared for these  activities by your physician.  Hygiene:  Do not soak your incisions.  Showers are acceptable but you may not take a bath or swim in a pool.  Cleanse your incisions daily with soap and water.  Medications:  Please resume taking any medications that you were taking prior to the surgery.  If we have prescribed any new medications for you, please take them as directed.  Constipation:  It is fairly common to experience some difficulty in moving your bowels following major surgery.  Being active will help to reduce this likelihood. A diet rich in fiber and plenty of liquids is desirable.  If you do become constipated, a mild laxative such as Miralax, Milk of Magnesia, or Metamucil, or a stool softener such as Colace, is recommended.  General Instructions: If you develop a fever of 100.5 degrees or higher, please call the office number(s) below for physician on call.    

## 2021-08-16 NOTE — Transfer of Care (Signed)
Immediate Anesthesia Transfer of Care Note  Patient: Sabrina Hahn  Procedure(s) Performed: LAPAROSCOPIC BILATERAL SALPINGECTOMY (Bilateral: Abdomen)  Patient Location: PACU  Anesthesia Type:General  Level of Consciousness: awake, alert  and sedated  Airway & Oxygen Therapy: Patient connected to face mask oxygen  Post-op Assessment: Post -op Vital signs reviewed and stable  Post vital signs: stable  Last Vitals:  Vitals Value Taken Time  BP 132/90 08/16/21 0846  Temp    Pulse 98 08/16/21 0848  Resp 17 08/16/21 0848  SpO2 100 % 08/16/21 0848  Vitals shown include unvalidated device data.  Last Pain:  Vitals:   08/16/21 0636  TempSrc:   PainSc: 0-No pain      Patients Stated Pain Goal: 0 (08/16/21 0636)  Complications: No notable events documented.

## 2021-08-16 NOTE — Anesthesia Postprocedure Evaluation (Signed)
Anesthesia Post Note  Patient: Sabrina Hahn  Procedure(s) Performed: LAPAROSCOPIC BILATERAL SALPINGECTOMY (Bilateral: Abdomen)     Patient location during evaluation: PACU Anesthesia Type: General Level of consciousness: awake and alert Pain management: pain level controlled Vital Signs Assessment: post-procedure vital signs reviewed and stable Respiratory status: spontaneous breathing, nonlabored ventilation and respiratory function stable Cardiovascular status: blood pressure returned to baseline and stable Postop Assessment: no apparent nausea or vomiting Anesthetic complications: no   No notable events documented.  Last Vitals:  Vitals:   08/16/21 0930 08/16/21 0945  BP: 118/81 119/74  Pulse: 75 64  Resp: (!) 21 (!) 27  Temp:  36.7 C  SpO2: 98% 96%    Last Pain:  Vitals:   08/16/21 0945  TempSrc:   PainSc: 3                  Kayline Sheer,W. EDMOND

## 2021-08-16 NOTE — Op Note (Addendum)
Operative Note   08/16/2021  PRE-OP DIAGNOSIS: Desire for permanent sterilization   POST-OP DIAGNOSIS: Same. ?endometriosis in  the right utero-sacral area  SURGEON: Surgeon(s) and Role:    * Mykael Batz, Billey Gosling, MD - Primary  ASSISTANT: None  ANESTHESIA: General   PROCEDURE: LAPAROSCOPIC BILATERAL SALPINGECTOMY   ESTIMATED BLOOD LOSS: 60mL  DRAINS: indwelling foley 92mL   TOTAL IV FLUIDS: per anesthesia note  SPECIMENS: bilateral fallopian tubes   VTE PROPHYLAXIS: SCDs to the bilateral lower extremities  ANTIBIOTICS: not indicated  COMPLICATIONS: None  DISPOSITION: PACU - hemodynamically stable.  CONDITION: stable  FINDINGS: Exam under anesthesia revealed an anteverted uterus approximately 8-10 week size, normal shape, and no adnexal masses. Laparoscopic survey of the abdomen revealed a grossly normal uterus, tubes, ovaries, liver, and stomach edge; no intra-abdominal adhesions were noted. Patient sounded to 8.5cm   At the right utero sacral fold, there was one, focal powder burn like mark and a few cm medial, a similar mark in the recto vaginal space.   PROCEDURE IN DETAIL: The patient was taken to the OR where anesthesia was administed. The patient was positioned in dorsal lithotomy in the [place] stirrups. The patient was then examined under anesthesia with the above noted findings. The patient was prepped and draped in the normal sterile fashion and foley catheter was placed. A Graves speculum was placed in the vagina and the anterior lip of the cervix was grasped with a single toothed tenaculum.  A Hulka uterine manipulator was then inserted in the uterus and uterine mobility was found to be satisfactory; the speculum and tenaculum were then removed.  After changing gloves, attention was turned to the patient's abdomen where a 19mm skin incision was made in the umbilical fold, after injection of local anesthesia. Using the open technique, the abdomen was entered and the  balloon trocar placed; pneumoperitoneum was then obtained. The operative laparoscope was introduced into the abdomen with the above noted findings, after inspection of the entry site and then placing the patient in Trendelenburg. Next, the bilateral tubes were traced out to their respective fimbriae, with the above noted findings.  After injection of local anesthesia, a 29mm ports were placed in the  under direct visualization in the right and left lower quadrants. Using the Ligasure, the serial mesosalpinx on each side were serially cauterized and cut and transected at the cornua, with the tubes removed via the ports.    The 64mm ports were removed under direct visualization and the air released via the umbilical port. The fascia at the umbilical incision was reapproximated with 0 vicryl. The skin was closed with 4-0 monocryl and dermabond. The Hulka was removed with no bleeding noted from the cervix and all other instrumentation was removed from the vagina.  The foley catheter was removed. The patient tolerated the procedure well. All counts were correct x 2. The patient was transferred to the recovery room awake, alert and breathing independently.   Cornelia Copa MD Attending Center for [employer] [facility])

## 2021-08-16 NOTE — H&P (Signed)
Obstetrics & Gynecology Surgical H&P   Date of Surgery: 08/16/2021    Primary [facility]BGYN: [facility]  Reason for Admission: scheduled BTL  History of Present Illness: Sabrina Hahn is a 26 y.o. [phone] (Patient's last menstrual period was 08/04/2021.), with the above CC. PMHx is significant for gestational HTN, h/o ectopic (tx without surgery), PID   Patient just delivered on 4/9 but unfortunately her papers were not valid for 30 days yet. She had an uncomplicated VD and was discharged to home; pregnancy only c/b FGR  Patient stopped her HTN meds, as scheduled 2 weeks ago, and she is doing well and w/o any problems  ROS: A 12-point review of systems was performed and negative, except as stated in the above HPI.  OBGYN History: As per HPI. OB History  Gravida Para Term Preterm AB Living  3 2 2   1 2   SAB IAB Ectopic Multiple Live Births      1 0 2    # Outcome Date GA Lbr Len/2nd Weight Sex Delivery Anes PTL Lv  3 Term 06/26/21 [redacted]w[redacted]d 11:11 / 00:01 2690 g M Vag-Spont None  LIV  2 Term 05/28/20 [redacted]w[redacted]d 02:06 / 00:19 3549 g F Vag-Spont Local  LIV  1 Ectopic              Past Medical History: Past Medical History:  Diagnosis Date   Anemia    Ectopic pregnancy    History of postpartum hemorrhage    Hypertension    Missed period 01/02/2019   PID (acute pelvic inflammatory disease)    Pneumonia    Women's annual routine gynecological examination 01/02/2019    Past Surgical History: Past Surgical History:  Procedure Laterality Date   NO PAST SURGERIES      Family History:  Family History  Problem Relation Age of Onset   Cancer Other    Hypertension Mother    Hypertension Father     Social History:  Social History   Socioeconomic History   Marital status: Single    Spouse name: Not on file   Number of children: Not on file   Years of education: Not on file   Highest education level: Not on file  Occupational History   Not on file   Tobacco Use   Smoking status: Never   Smokeless tobacco: Never  Vaping Use   Vaping Use: Former   Start date: 05/19/2019   Quit date: 09/06/2019  Substance and Sexual Activity   Alcohol use: No   Drug use: No   Sexual activity: Yes    Birth control/protection: None  Other Topics Concern   Not on file  Social History Narrative   Not on file   Social Determinants of Health   Financial Resource Strain: Not on file  Food Insecurity: No Food Insecurity   Worried About 09/08/2019 of Food in the Last Year: Never true   Ran Out of Food in the Last Year: Never true  Transportation Needs: No Transportation Needs   Lack of Transportation (Medical): No   Lack of Transportation (Non-Medical): No  Physical Activity: Not on file  Stress: Not on file  Social Connections: Not on file  Intimate Partner Violence: Not on file   Allergy: Allergies  Allergen Reactions   Cinnamon Cough    Current Outpatient Medications: Medications Prior to Admission  Medication Sig Dispense Refill Last Dose   Prenatal Vit-Fe Fumarate-FA (PRENATAL MULTIVITAMIN) TABS tablet Take 1 tablet by mouth daily  at 12 noon.   08/15/2021     Hospital Medications: Current Facility-Administered Medications  Medication Dose Route Frequency Provider Last Rate Last Admin   0.9 %  sodium chloride infusion   Intravenous Continuous Cataract Bing, MD       acetaminophen (TYLENOL) tablet 1,000 mg  1,000 mg Oral Once Finucane, Elizabeth M, DO       chlorhexidine (PERIDEX) 0.12 % solution 15 mL  15 mL Mouth/Throat Once Finucane, Elizabeth M, DO       Or   MEDLINE mouth rinse  15 mL Mouth Rinse Once Finucane, Elizabeth M, DO       lactated ringers infusion   Intravenous Continuous Finucane, Elizabeth M, DO       scopolamine (TRANSDERM-SCOP) 1 MG/3DAYS 1.5 mg  1 patch Transdermal Q72H Lannie Fields, DO         Physical Exam:  Current Vital Signs 24h Vital Sign Ranges  T 98.4 F (36.9 C) Temp  Avg: 98.4 F (36.9  C)  Min: 98.4 F (36.9 C)  Max: 98.4 F (36.9 C)  BP 123/84 BP  Min: 123/84  Max: 123/84  HR 80 Pulse  Avg: 80  Min: 80  Max: 80  RR 18 Resp  Avg: 18  Min: 18  Max: 18  SaO2 96 % Room Air SpO2  Avg: 96 %  Min: 96 %  Max: 96 %       24 Hour I/O Current Shift I/O  Time Ins Outs No intake/output data recorded. No intake/output data recorded.    Body mass index is 25.32 kg/m. General appearance: Well nourished, well developed female in no acute distress.  Cardiovascular: S1, S2 normal, no murmur, rub or gallop, regular rate and rhythm Respiratory:  Clear to auscultation bilateral. Normal respiratory effort Abdomen: positive bowel sounds and no masses, hernias; diffusely non tender to palpation, non distended Neuro/Psych:  Normal mood and affect.  Skin:  Warm and dry.  Extremities: no clubbing, cyanosis, or edema.    Laboratory: UPT: negative BMP: pending Recent Labs  Lab 08/16/21 0629  WBC 10.5  HGB 12.3  HCT 38.2  PLT 359    Imaging:  none  Assessment: Sabrina Hahn is a 26 y.o. [id number] (Patient's last menstrual period was 08/04/2021.) here for scheduled surgery  Plan: Patient doing well. D/w her re: surgery and plan for salpingectomies and she is amenable to this. Can proceed when OR is ready. Post op instructions reviewed with patient and plan for d/c from PACU   Cornelia Copa MD Attending Center for [facility])

## 2021-08-17 ENCOUNTER — Encounter (HOSPITAL_COMMUNITY): Payer: Self-pay | Admitting: Obstetrics and Gynecology

## 2021-08-17 LAB — SURGICAL PATHOLOGY

## 2023-12-06 ENCOUNTER — Other Ambulatory Visit: Payer: Self-pay

## 2023-12-06 ENCOUNTER — Encounter (HOSPITAL_COMMUNITY): Payer: Self-pay | Admitting: *Deleted

## 2023-12-06 ENCOUNTER — Ambulatory Visit (HOSPITAL_COMMUNITY): Admission: EM | Admit: 2023-12-06 | Discharge: 2023-12-06 | Disposition: A | Discharge status: Home / Self Care

## 2023-12-06 DIAGNOSIS — B9689 Other specified bacterial agents as the cause of diseases classified elsewhere: Secondary | ICD-10-CM

## 2023-12-06 DIAGNOSIS — J019 Acute sinusitis, unspecified: Secondary | ICD-10-CM

## 2023-12-06 MED ORDER — AMOXICILLIN-POT CLAVULANATE 875-125 MG PO TABS: 1.0000 | ORAL_TABLET | Freq: Two times a day (BID) | ORAL | 0 refills | Status: AC

## 2023-12-06 NOTE — ED Provider Notes (Signed)
  PCP: Ebb Lauraine Flatness, NP Chief Complaint: Headache, Torticollis, and Fever  Subjective:   HPI: Patient is a 28 y.o. female here for cough, sinus congestion and pressure since Saturday.  She states she was having intense sinus pressure, headache and cough.  She was taking sinus medicine, Tylenol  ibuprofen  and this seemed to help.  Patient states that on Sunday and Monday she actually felt quite improved.  And then on Tuesday Wednesday her symptoms have continued to worsen with increased sinus pressure, congestion, and headache and cough.  She states that she had a fever of 102 last night.  And has been having chills.  Past Medical History:  Diagnosis Date   Anemia    Ectopic pregnancy    History of postpartum hemorrhage    Hypertension    Missed period 01/02/2019   PID (acute pelvic inflammatory disease)    Pneumonia    Women's annual routine gynecological examination 01/02/2019    No current facility-administered medications on file prior to encounter.   Current Outpatient Medications on File Prior to Encounter  Medication Sig Dispense Refill   ibuprofen  (ADVIL ) 600 MG tablet Take 1 tablet (600 mg total) by mouth every 6 (six) hours as needed. 20 tablet 0   oxyCODONE -acetaminophen  (PERCOCET/ROXICET) 5-325 MG tablet Take 1 tablet by mouth every 6 (six) hours as needed. 8 tablet 0   Prenatal Vit-Fe Fumarate-FA (PRENATAL MULTIVITAMIN) TABS tablet Take 1 tablet by mouth daily at 12 noon.      BP 110/75   Pulse 74   Temp 98.2 F (36.8 C)   Resp 20   LMP 11/07/2023   SpO2 99%        Objective:   Gen: Well developed, well nourished female in no acute distress. HEENT: Pupils equal, round, and reactive to light.  Conjunctiva non-injected.  Nares patent without discharge.  Congestion present.  Oral mucosa is mildly dry.  Posterior pharynx clear without erythema. CV: Regular rate and rhythm without murmurs, gallops, or rubs. Lungs: Clear to auscultation bilaterally with good  effort Ext: No cyanosis, clubbing, or edema.  Assessment/Plan:   Sabrina Hahn is a 28 y.o. female who was seen today for the following: 1. Acute bacterial rhinosinusitis (Primary) - Patient has classic symptoms of sinus infection, improved symptoms and worsening symptoms - Likely viral to bacterial pathology has occurred - Will send in Augmentin  twice daily for 7 days - Patient can continue taking Tylenol  and ibuprofen  for headache as needed - Focus on hydration and rest - Follow-up with us  as needed  Follow-up/Education:   May return sooner as needed and encouraged to call/e-mail for additional questions or  worsening symptoms in the interim.  Krystal Lowing, DO Sports Medicine Fellow 12/06/2023 12:31 PM  Disclaimer: This transcription was electronically signed. It was transcribed by [place] and may contain errors in the text that were not recognized on proofreading.      Lowing Krystal HERO, DO 12/06/23 1231

## 2023-12-06 NOTE — ED Triage Notes (Signed)
 PT reports she caught a cold from her nephew and she treated with OTC . Pt reports she felt totally fine on Sunday. Pt reports On Monday she felt worse. She has neck pain shooting from base of neck to base of head. Pt also had chills,fever and pain to top of back . Pt reports having a cough also.

## 2024-01-09 IMAGING — US US MFM UA CORD DOPPLER
1 series · 15 of 28 positions shown · non-contrast
Comparison: none

[Series 1: us mfm ua cord doppler · 38 acquisitions, 15 frames shown]
[im 1/38]
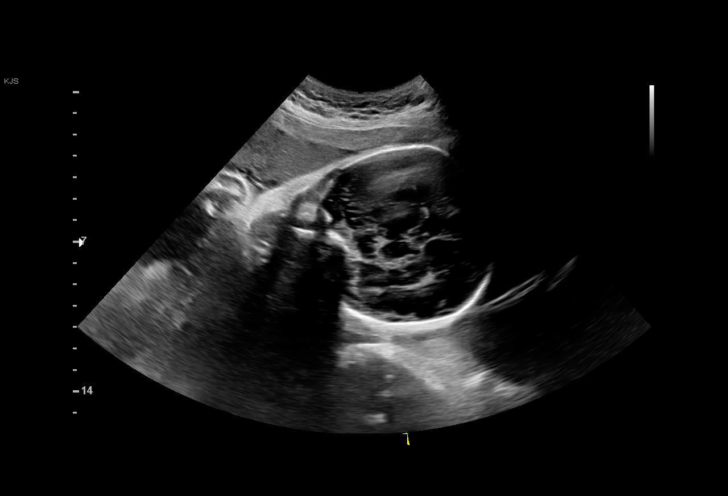
[im 3/38]
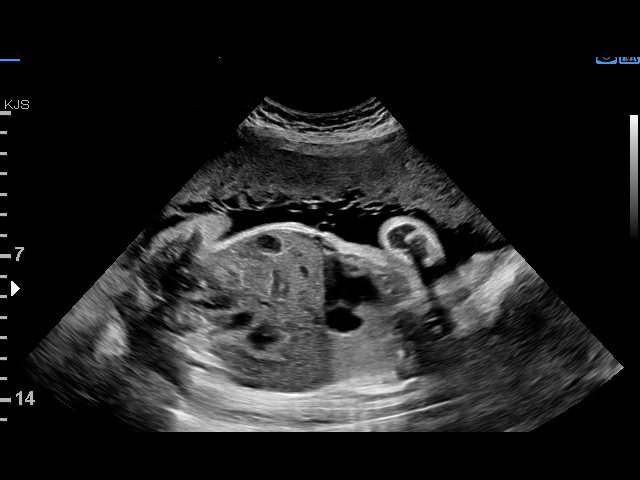
[im 6/38]
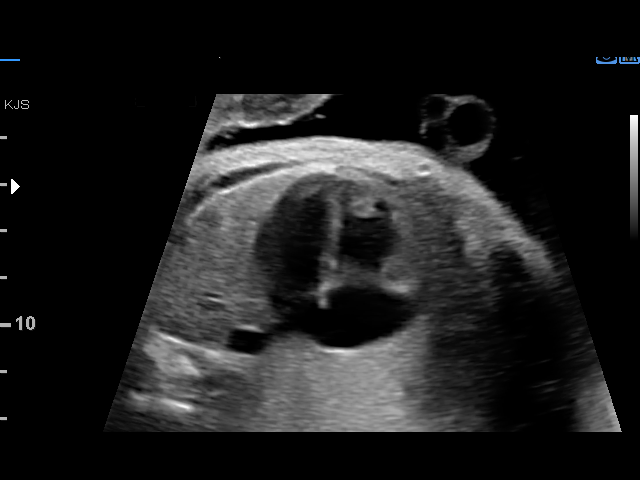
[im 9/38]
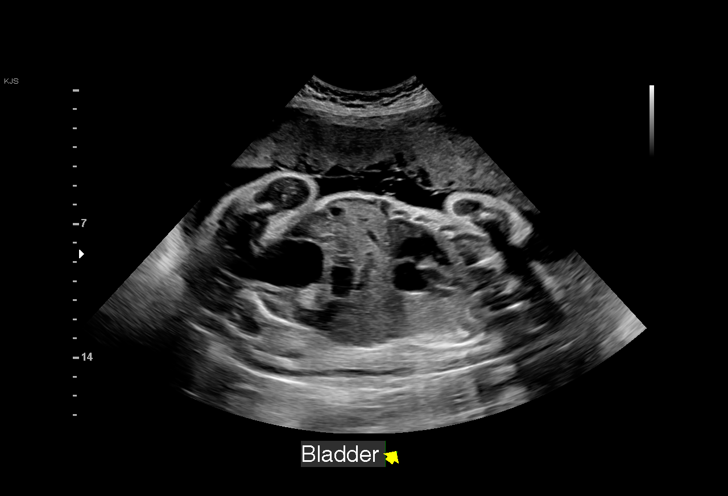
[im 11/38]
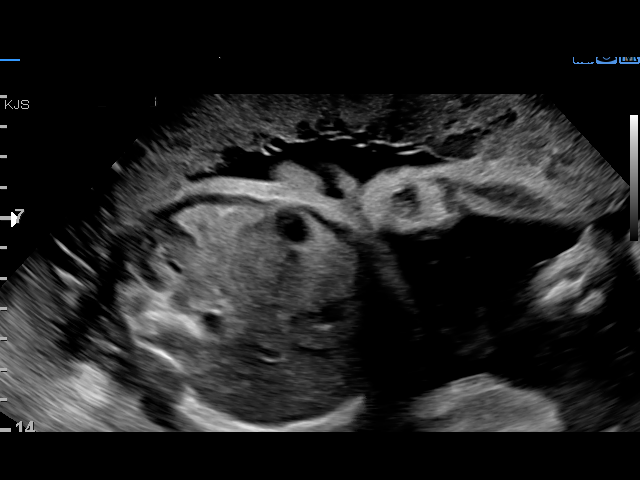
[im 14/38]
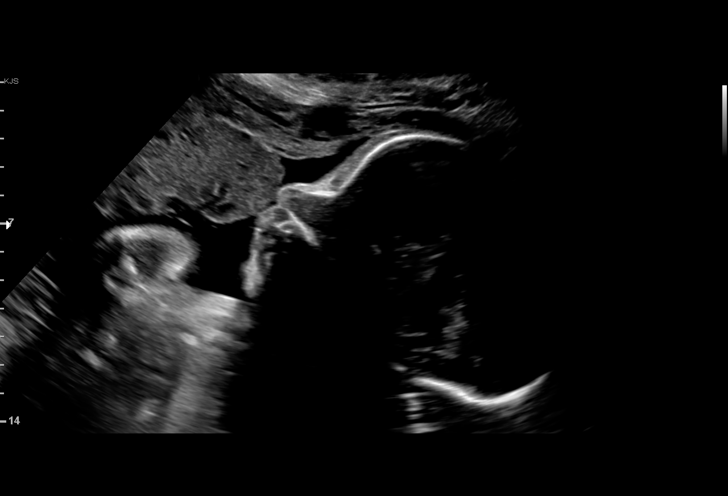
[im 17/38]
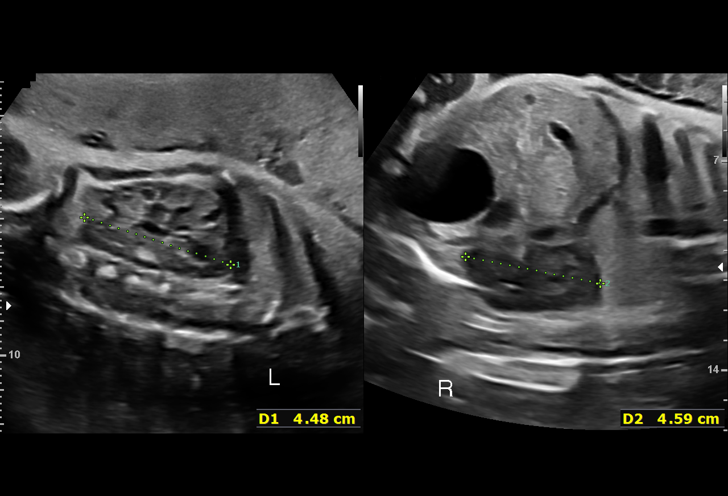
[im 20/38]
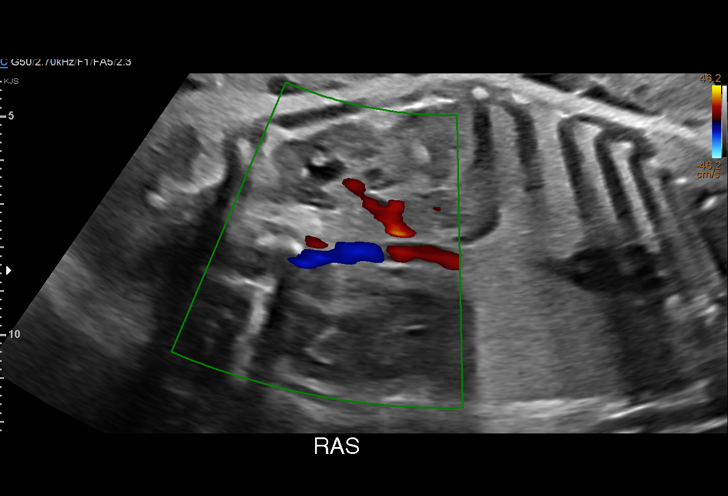
[im 21/38]
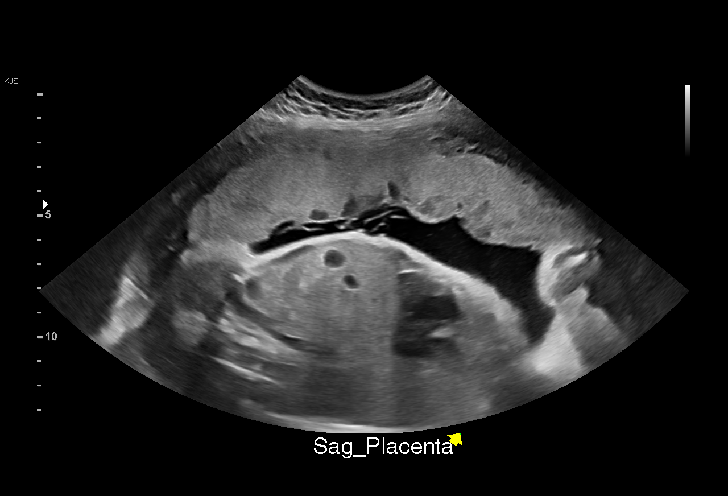
[im 24/38]
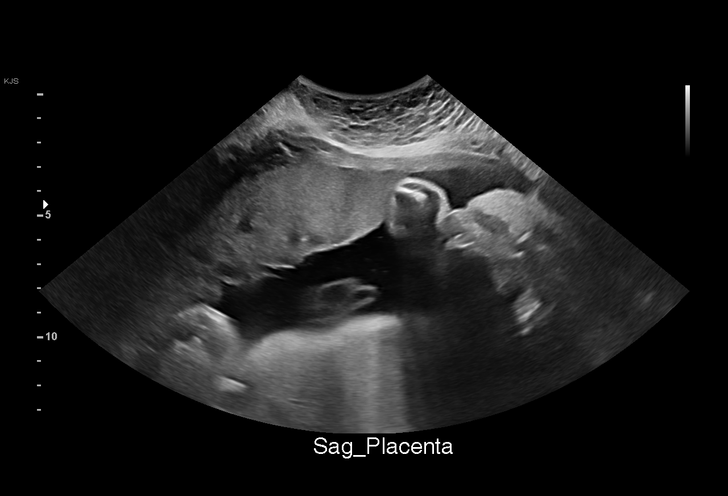
[im 27/38]
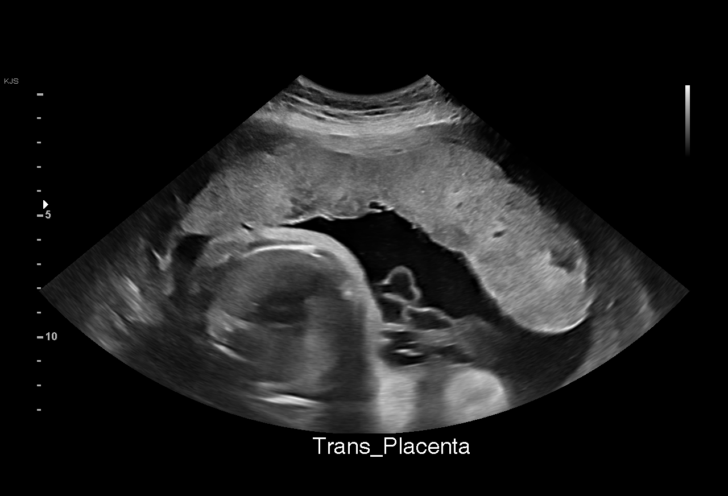
[im 29/38]
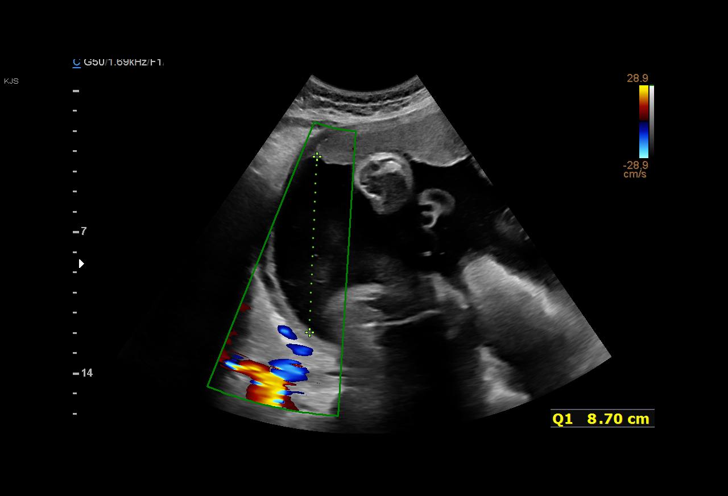
[im 32/38]
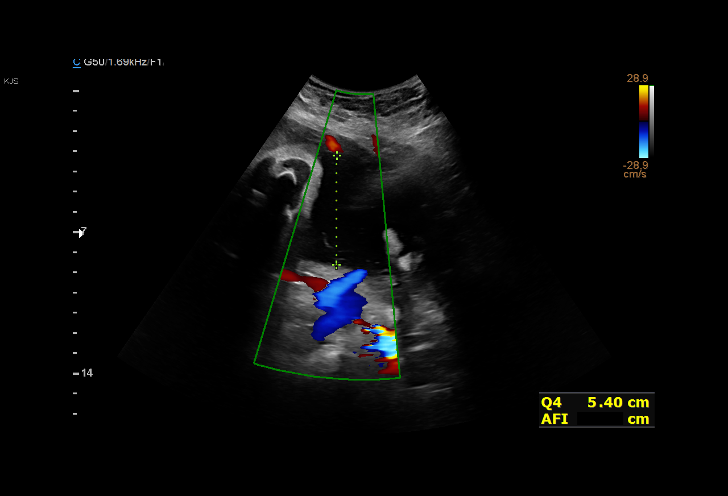
[im 35/38]
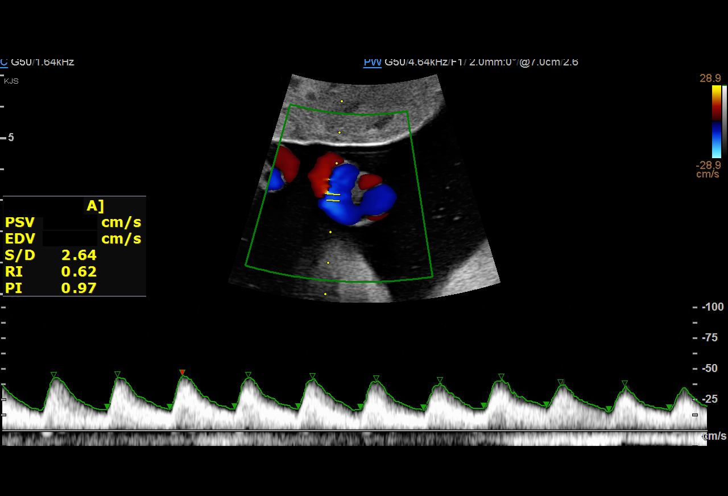
[im 38/38]
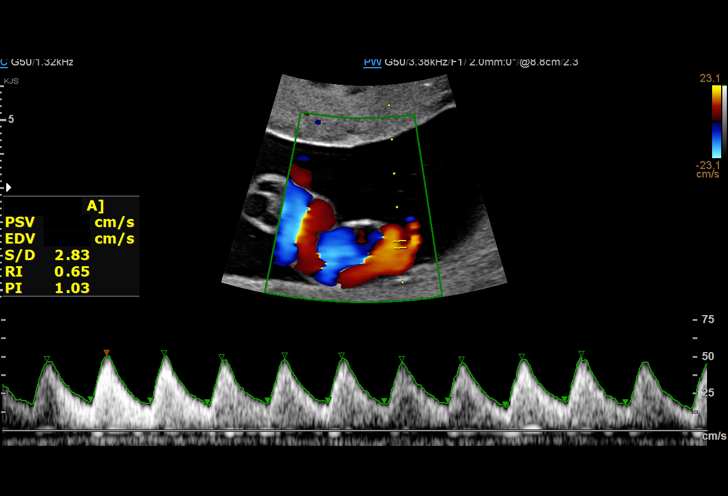

[15 of 28 positions shown; findings below may reference images not displayed]

ROSIEL NP

 2  US MFM UA CORD DOPPLER                76820.02    T[facility]ER
                                                      HUGH

Indications

 Maternal care for known or suspected poor
 fetal growth, third trimester, not applicable or
 unspecified IUGR
 Polyhydramnios, third trimester, antepartum
 condition or complication, unspecified fetus
 Late to prenatal care, third trimester
 Short interval between pregancies, 3rd
 trimester
 Obesity complicating pregnancy, third
 trimester (BMI 30)
 Poor obstetrical history (Hx of ectopic 6636)
 33 weeks gestation of pregnancy
Fetal Evaluation

 Num Of Fetuses:         1
 Fetal Heart Rate(bpm):  141
 Cardiac Activity:       Observed
 Presentation:           Cephalic
 Placenta:               Anterior
 P. Cord Insertion:      Previously Visualized

 Amniotic Fluid
 AFI FV:      Polyhydramnios

 AFI Sum(cm)     %Tile       Largest Pocket(cm)
 28.9            > 97
 RUQ(cm)       RLQ(cm)       LUQ(cm)        LLQ(cm)

Biophysical Evaluation

 Amniotic F.V:   Pocket => 2 cm             F. Tone:        Observed
 F. Movement:    Observed                   Score:          [DATE]
 F. Breathing:   Observed
OB History

 Blood Type:   O+
 Gravidity:    3         Term:   1
 Ectopic:      1        Living:  1
Gestational Age

 LMP:           33w 0d        Date:  09/30/20                 EDD:   07/07/21
 Best:          33w 0d     Det. By:  LMP  (09/30/20)          EDD:   07/07/21
Doppler - Fetal Vessels

 Umbilical Artery
  S/D     %tile      RI    %tile      PI    %tile            ADFV    RDFV
  2.67       54    0.63       63    0.97       67               No      No

Impression

 Antenatal testing due to IUGR with an EFW 6th%
 Biophysical profile [DATE] with good fetal movement and
 polyhydramnios
 UA Dopplers are normal with no evidence of AEDF or REDF
Recommendations

 Continue weekly testing with UA Dopplers
 Repeat growth scheduled in 3 weeks.

## 2024-02-04 IMAGING — US US MFM FETAL BPP W/O NON-STRESS
1 series · 14 of 28 positions shown · non-contrast
Comparison: none

[Series 1: us mfm fetal bpp w/o non-stress · 34 acquisitions, 14 frames shown]
[im 2/34]
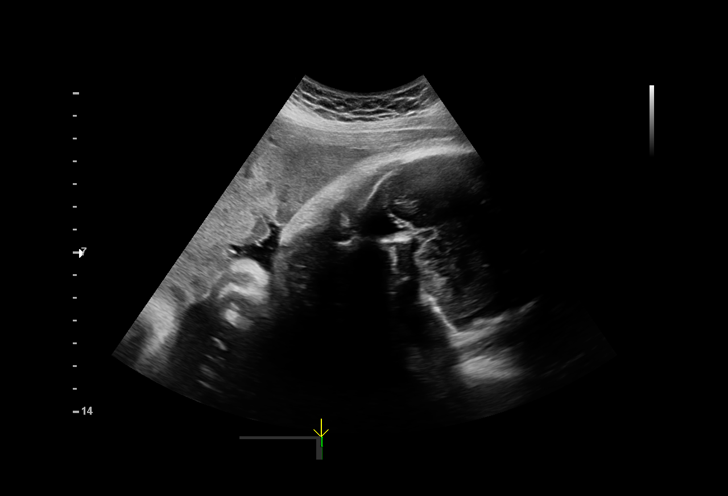
[im 4/34]
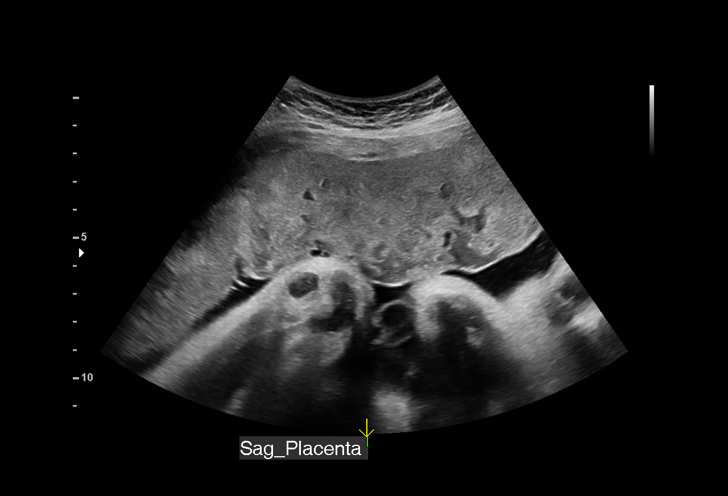
[im 7/34]
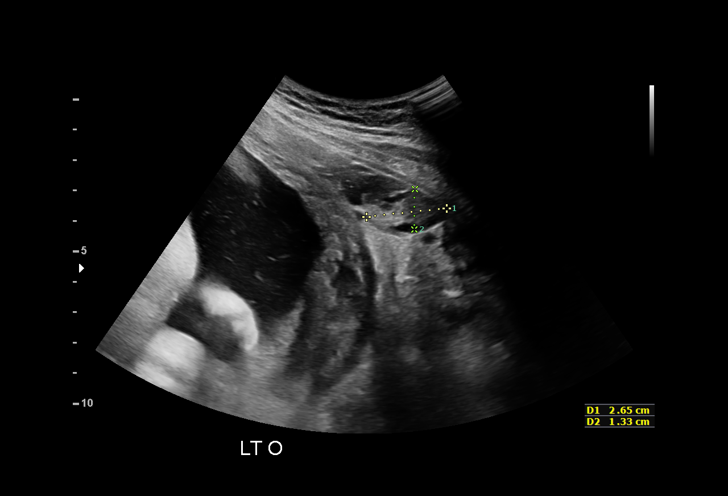
[im 9/34]
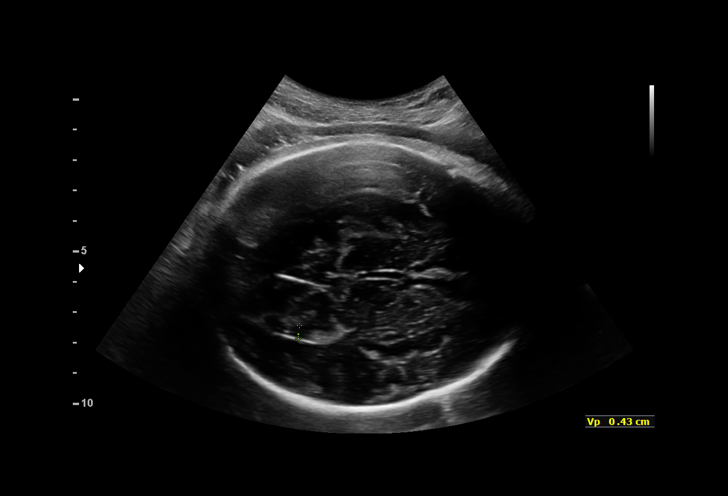
[im 12/34]
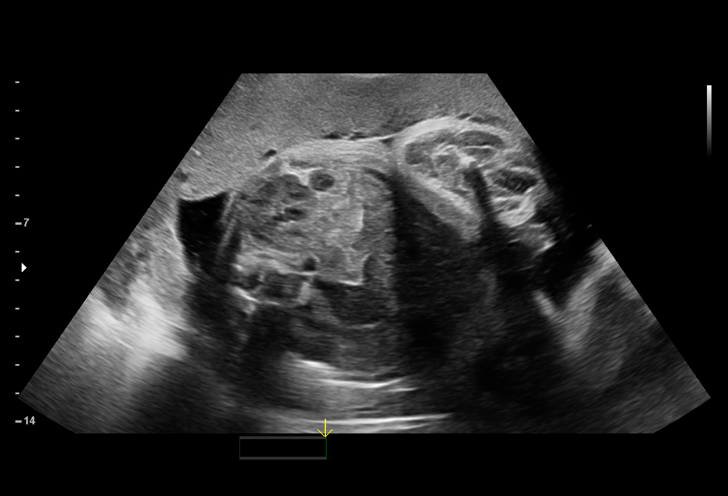
[im 14/34]
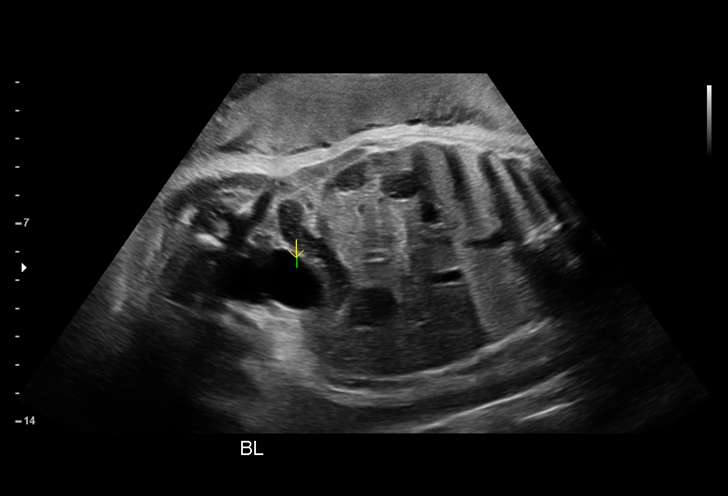
[im 16/34]
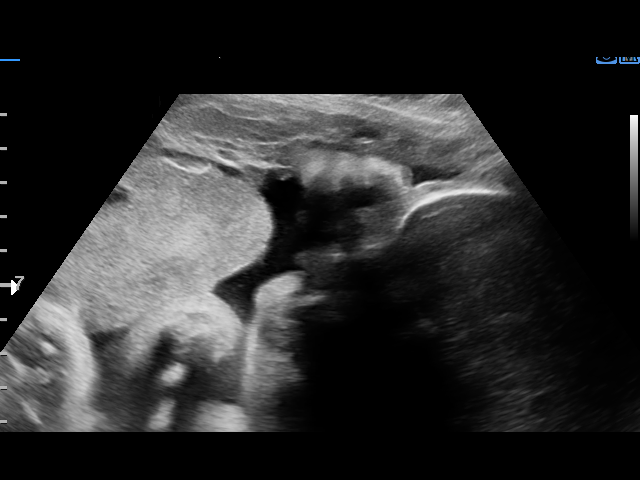
[im 19/34]
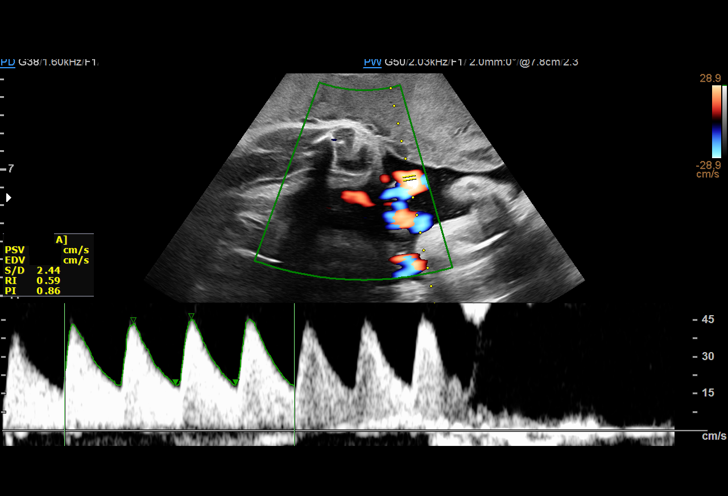
[im 21/34]
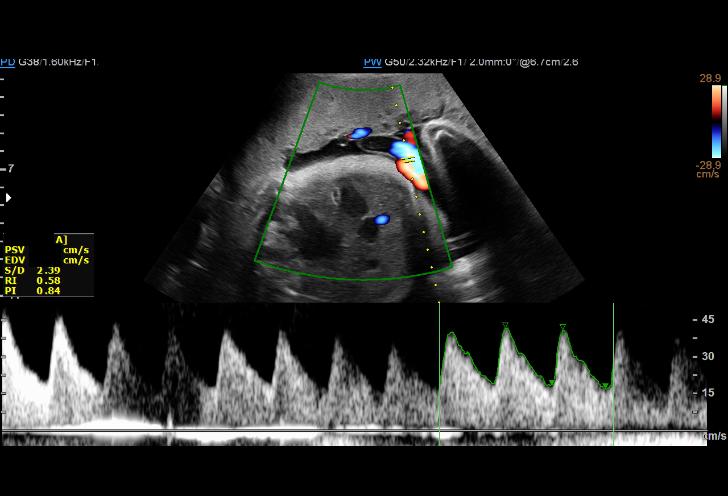
[im 24/34]
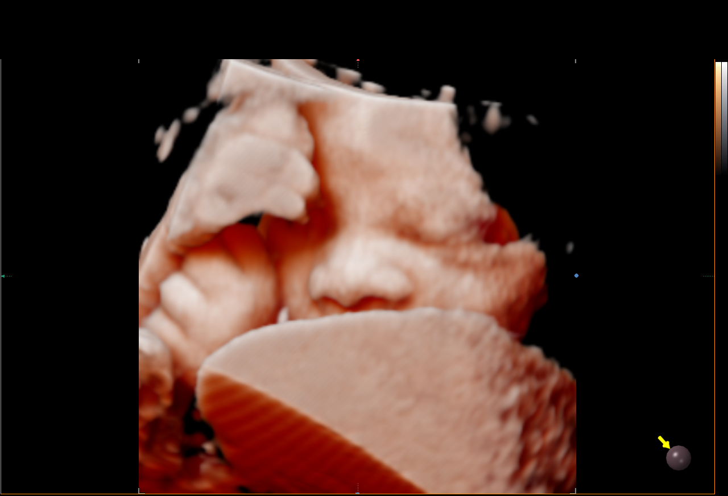
[im 26/34]
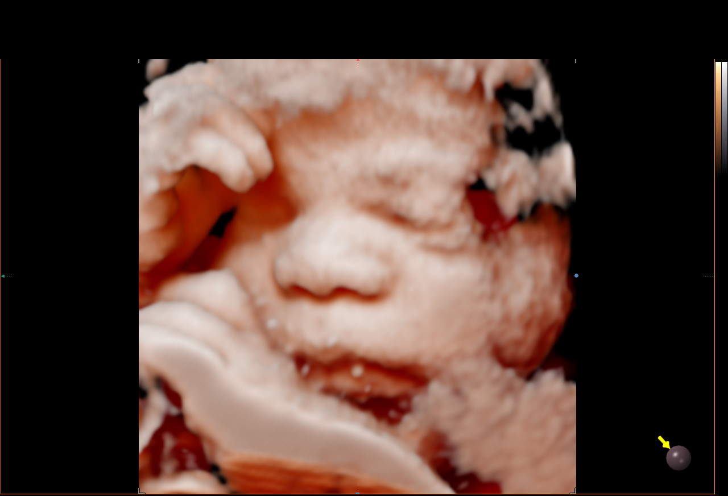
[im 29/34]
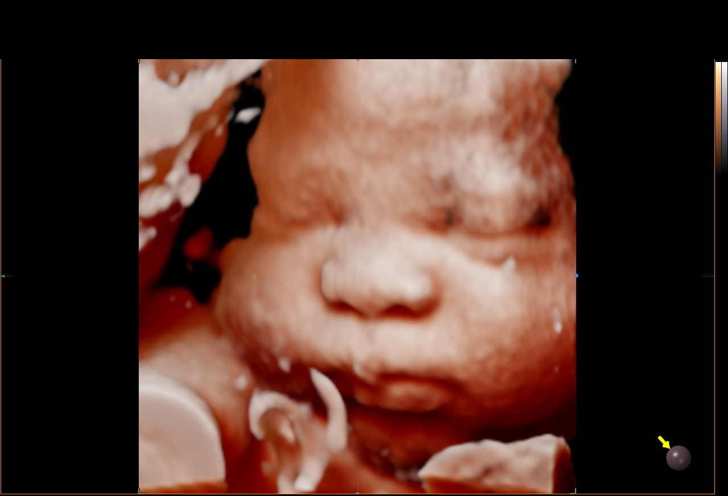
[im 31/34]
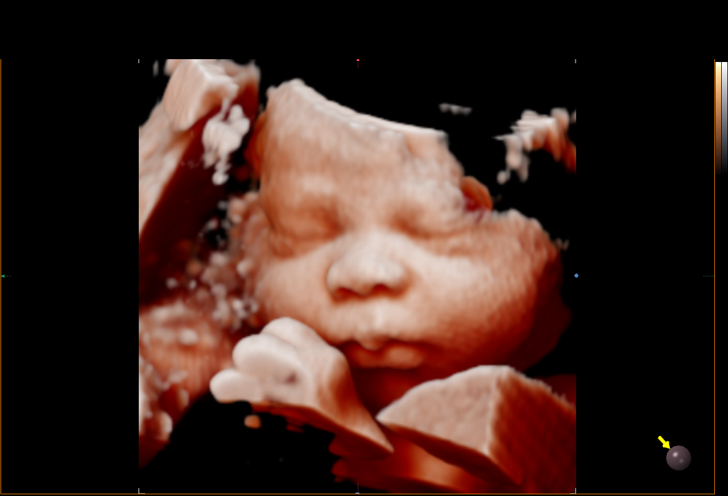
[im 34/34]
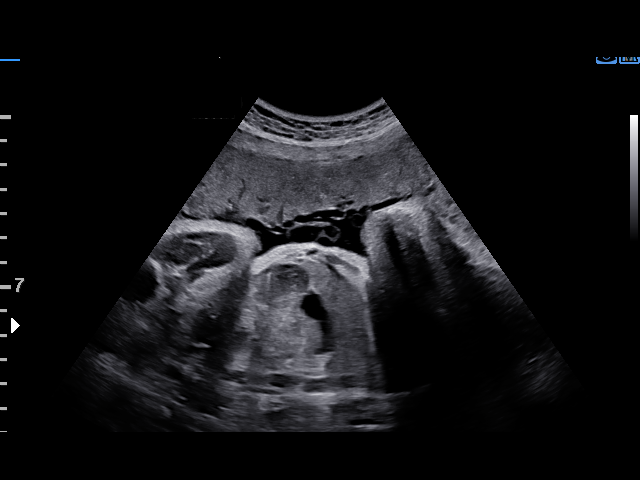

[14 of 28 positions shown; findings below may reference images not displayed]

IR SIGGIH NP

Indications

 Maternal care for known or suspected poor
 fetal growth, third trimester, not applicable or
 unspecified IUGR
 Late to prenatal care, third trimester
 Short interval between pregancies, 3rd
 trimester
 Obesity complicating pregnancy, third
 trimester (BMI 30)
 Poor obstetrical history (Hx of ectopic 4451)
 36 weeks gestation of pregnancy
Fetal Evaluation

 Num Of Fetuses:         1
 Fetal Heart Rate(bpm):  144
 Cardiac Activity:       Observed
 Presentation:           Cephalic
 Placenta:               Anterior
 P. Cord Insertion:      Previously Visualized

 Amniotic Fluid
 AFI FV:      Within normal limits

 AFI Sum(cm)     %Tile       Largest Pocket(cm)
 20.09           77

 RUQ(cm)       RLQ(cm)       LUQ(cm)        LLQ(cm)

Biophysical Evaluation

 Amniotic F.V:   Within normal limits       F. Tone:        Observed
 F. Movement:    Observed                   Score:          [DATE]
 F. Breathing:   Observed
Biometry

 LV:        4.3  mm
OB History

 Blood Type:   O+
 Gravidity:    3         Term:   1
 Ectopic:      1        Living:  1
Gestational Age

 LMP:           36w 5d        Date:  09/30/20                 EDD:   07/07/21
 Best:          36w 5d     Det. By:  LMP  (09/30/20)          EDD:   07/07/21
Anatomy

 Ventricles:            Appears normal         Kidneys:                Appear normal
 Heart:                 Appears normal         Bladder:                Appears normal
                        (4CH, axis, and
                        situs)
 Stomach:               Appears normal, left
                        sided
Doppler - Fetal Vessels

 Umbilical Artery
  S/D     %tile      RI    %tile      PI    %tile            ADFV    RDFV
  3.43       95    0.71       96    1.12       94               No      No

Cervix Uterus Adnexa

 Cervix
 Not visualized (advanced GA >88wks)

 Right Ovary
 Visualized.

 Left Ovary
 Visualized.
Comments

 This patient was seen due to an IUGR fetus.  She denies any
 problems since her last exam.  She reports feeling vigorous
 fetal movements throughout the day.
 A biophysical profile performed today was [DATE].
 There was normal amniotic fluid noted on today's ultrasound
 exam.
 Doppler studies of the umbilical arteries performed due to
 fetal growth restriction showed a normal S/D ratio of 3.43.
 There were no signs of absent or reversed end-diastolic flow
 noted today.
 She will return in 1 week for a growth scan, BPP, and
 umbilical artery Doppler study.
